# Patient Record
Sex: Male | Born: 1960 | Race: White | Hispanic: No | Smoking: Current every day smoker
Health system: Southern US, Community
[De-identification: ages and names within clinical notes are randomized; demographics above are authoritative.]

## PROBLEM LIST (undated history)

## (undated) DIAGNOSIS — I10 Essential (primary) hypertension: Secondary | ICD-10-CM

## (undated) HISTORY — PX: NECK SURGERY: SHX720

---

## 1999-11-25 ENCOUNTER — Encounter: Payer: Self-pay | Admitting: Emergency Medicine

## 1999-11-25 ENCOUNTER — Emergency Department (HOSPITAL_COMMUNITY): Admission: EM | Admit: 1999-11-25 | Discharge: 1999-11-25 | Payer: Self-pay | Admitting: Emergency Medicine

## 2000-04-08 ENCOUNTER — Emergency Department (HOSPITAL_COMMUNITY): Admission: EM | Admit: 2000-04-08 | Discharge: 2000-04-08 | Payer: Self-pay | Admitting: Emergency Medicine

## 2001-11-11 ENCOUNTER — Emergency Department (HOSPITAL_COMMUNITY): Admission: EM | Admit: 2001-11-11 | Discharge: 2001-11-11 | Payer: Self-pay | Admitting: *Deleted

## 2002-04-11 ENCOUNTER — Emergency Department (HOSPITAL_COMMUNITY): Admission: EM | Admit: 2002-04-11 | Discharge: 2002-04-11 | Payer: Self-pay | Admitting: Emergency Medicine

## 2003-01-02 ENCOUNTER — Emergency Department (HOSPITAL_COMMUNITY): Admission: EM | Admit: 2003-01-02 | Discharge: 2003-01-02 | Payer: Self-pay | Admitting: Emergency Medicine

## 2003-01-02 ENCOUNTER — Encounter: Payer: Self-pay | Admitting: Emergency Medicine

## 2003-01-03 ENCOUNTER — Emergency Department (HOSPITAL_COMMUNITY): Admission: EM | Admit: 2003-01-03 | Discharge: 2003-01-03 | Payer: Self-pay | Admitting: *Deleted

## 2006-01-26 ENCOUNTER — Emergency Department (HOSPITAL_COMMUNITY): Admission: EM | Admit: 2006-01-26 | Discharge: 2006-01-26 | Payer: Self-pay | Admitting: Emergency Medicine

## 2006-06-26 ENCOUNTER — Ambulatory Visit (HOSPITAL_COMMUNITY): Admission: RE | Admit: 2006-06-26 | Discharge: 2006-06-26 | Payer: Self-pay | Admitting: Family Medicine

## 2007-05-08 ENCOUNTER — Ambulatory Visit: Payer: Self-pay | Admitting: Orthopedic Surgery

## 2007-05-22 ENCOUNTER — Ambulatory Visit: Payer: Self-pay | Admitting: Orthopedic Surgery

## 2007-05-28 ENCOUNTER — Encounter (HOSPITAL_COMMUNITY): Admission: RE | Admit: 2007-05-28 | Discharge: 2007-06-27 | Payer: Self-pay | Admitting: Orthopedic Surgery

## 2007-06-19 ENCOUNTER — Ambulatory Visit: Payer: Self-pay | Admitting: Orthopedic Surgery

## 2007-07-02 ENCOUNTER — Ambulatory Visit: Payer: Self-pay | Admitting: Orthopedic Surgery

## 2007-07-15 ENCOUNTER — Encounter: Payer: Self-pay | Admitting: Orthopedic Surgery

## 2007-08-06 ENCOUNTER — Ambulatory Visit (HOSPITAL_COMMUNITY): Admission: RE | Admit: 2007-08-06 | Discharge: 2007-08-07 | Payer: Self-pay | Admitting: Neurosurgery

## 2007-08-27 ENCOUNTER — Encounter: Payer: Self-pay | Admitting: Orthopedic Surgery

## 2007-09-29 ENCOUNTER — Encounter: Payer: Self-pay | Admitting: Orthopedic Surgery

## 2007-10-30 ENCOUNTER — Encounter: Payer: Self-pay | Admitting: Orthopedic Surgery

## 2007-11-26 ENCOUNTER — Ambulatory Visit (HOSPITAL_COMMUNITY): Admission: RE | Admit: 2007-11-26 | Discharge: 2007-11-26 | Payer: Self-pay | Admitting: Urology

## 2008-01-06 ENCOUNTER — Encounter: Payer: Self-pay | Admitting: Orthopedic Surgery

## 2008-02-05 ENCOUNTER — Encounter: Payer: Self-pay | Admitting: Orthopedic Surgery

## 2008-02-06 ENCOUNTER — Encounter: Payer: Self-pay | Admitting: Orthopedic Surgery

## 2008-03-01 ENCOUNTER — Encounter: Payer: Self-pay | Admitting: Orthopedic Surgery

## 2008-04-14 ENCOUNTER — Encounter: Payer: Self-pay | Admitting: Orthopedic Surgery

## 2008-04-20 ENCOUNTER — Encounter: Payer: Self-pay | Admitting: Orthopedic Surgery

## 2008-05-18 ENCOUNTER — Encounter: Payer: Self-pay | Admitting: Infectious Diseases

## 2008-08-05 ENCOUNTER — Encounter: Payer: Self-pay | Admitting: Orthopedic Surgery

## 2008-08-25 ENCOUNTER — Observation Stay (HOSPITAL_COMMUNITY): Admission: RE | Admit: 2008-08-25 | Discharge: 2008-08-27 | Payer: Self-pay | Admitting: Neurosurgery

## 2008-09-16 ENCOUNTER — Encounter: Payer: Self-pay | Admitting: Orthopedic Surgery

## 2008-09-21 ENCOUNTER — Emergency Department (HOSPITAL_COMMUNITY): Admission: EM | Admit: 2008-09-21 | Discharge: 2008-09-21 | Payer: Self-pay | Admitting: Emergency Medicine

## 2008-10-26 ENCOUNTER — Encounter: Payer: Self-pay | Admitting: Orthopedic Surgery

## 2008-11-23 ENCOUNTER — Encounter: Payer: Self-pay | Admitting: Orthopedic Surgery

## 2008-12-04 ENCOUNTER — Encounter: Payer: Self-pay | Admitting: Orthopedic Surgery

## 2008-12-13 ENCOUNTER — Encounter: Payer: Self-pay | Admitting: Orthopedic Surgery

## 2009-01-06 ENCOUNTER — Ambulatory Visit: Payer: Self-pay | Admitting: Orthopedic Surgery

## 2009-01-06 DIAGNOSIS — R209 Unspecified disturbances of skin sensation: Secondary | ICD-10-CM

## 2009-01-10 ENCOUNTER — Encounter: Payer: Self-pay | Admitting: Orthopedic Surgery

## 2009-01-10 ENCOUNTER — Telehealth: Payer: Self-pay | Admitting: Orthopedic Surgery

## 2009-01-11 ENCOUNTER — Encounter: Payer: Self-pay | Admitting: Orthopedic Surgery

## 2009-01-12 ENCOUNTER — Encounter: Payer: Self-pay | Admitting: Orthopedic Surgery

## 2009-01-20 ENCOUNTER — Telehealth: Payer: Self-pay | Admitting: Orthopedic Surgery

## 2009-01-24 ENCOUNTER — Telehealth: Payer: Self-pay | Admitting: Orthopedic Surgery

## 2009-01-25 ENCOUNTER — Ambulatory Visit: Payer: Self-pay | Admitting: Orthopedic Surgery

## 2009-01-25 DIAGNOSIS — G5600 Carpal tunnel syndrome, unspecified upper limb: Secondary | ICD-10-CM | POA: Insufficient documentation

## 2009-02-03 ENCOUNTER — Encounter: Payer: Self-pay | Admitting: Orthopedic Surgery

## 2009-03-01 ENCOUNTER — Encounter: Payer: Self-pay | Admitting: Orthopedic Surgery

## 2009-05-02 ENCOUNTER — Encounter: Payer: Self-pay | Admitting: Orthopedic Surgery

## 2009-06-30 ENCOUNTER — Emergency Department (HOSPITAL_COMMUNITY): Admission: EM | Admit: 2009-06-30 | Discharge: 2009-07-01 | Payer: Self-pay | Admitting: Emergency Medicine

## 2009-11-16 ENCOUNTER — Emergency Department (HOSPITAL_COMMUNITY): Admission: EM | Admit: 2009-11-16 | Discharge: 2009-11-17 | Payer: Self-pay | Admitting: Emergency Medicine

## 2009-12-14 ENCOUNTER — Emergency Department (HOSPITAL_COMMUNITY): Admission: EM | Admit: 2009-12-14 | Discharge: 2009-12-14 | Payer: Self-pay | Admitting: Emergency Medicine

## 2011-01-19 LAB — POCT CARDIAC MARKERS
CKMB, poc: 1.3 ng/mL (ref 1.0–8.0)
Myoglobin, poc: 175 ng/mL (ref 12–200)

## 2011-01-19 LAB — DIFFERENTIAL
Basophils Relative: 1 % (ref 0–1)
Eosinophils Absolute: 0.1 10*3/uL (ref 0.0–0.7)
Lymphocytes Relative: 27 % (ref 12–46)
Monocytes Absolute: 0.4 10*3/uL (ref 0.1–1.0)
Neutro Abs: 6.7 10*3/uL (ref 1.7–7.7)

## 2011-01-19 LAB — RAPID URINE DRUG SCREEN, HOSP PERFORMED
Amphetamines: NOT DETECTED
Barbiturates: NOT DETECTED
Cocaine: NOT DETECTED
Tetrahydrocannabinol: POSITIVE — AB

## 2011-01-19 LAB — BASIC METABOLIC PANEL
BUN: 13 mg/dL (ref 6–23)
CO2: 24 mEq/L (ref 19–32)
Calcium: 8.7 mg/dL (ref 8.4–10.5)
Chloride: 105 mEq/L (ref 96–112)
Creatinine, Ser: 1.64 mg/dL — ABNORMAL HIGH (ref 0.4–1.5)
GFR calc Af Amer: 55 mL/min — ABNORMAL LOW (ref >60)
Potassium: 3.5 mEq/L (ref 3.5–5.1)

## 2011-01-19 LAB — CBC
Hemoglobin: 16 g/dL (ref 13.0–17.0)
RBC: 4.53 MIL/uL (ref 4.22–5.81)

## 2011-02-27 NOTE — Op Note (Signed)
NAME:  David Watson, David Watson NO.:  [id number]   MEDICAL RECORD NO.:  [id number]          PATIENT TYPE:  OIB   LOCATION:  5152                         FACILITY:  [facility]   PHYSICIAN:  Coletta Memos, M.D.     DATE OF BIRTH:  1961-07-14   DATE OF PROCEDURE:  08/06/2007  DATE OF DISCHARGE:                               OPERATIVE REPORT   PREOPERATIVE DIAGNOSES:  1. Displaced disk C6-7 right.  2. Right C7 radiculopathy.   POSTOPERATIVE DIAGNOSES:  1. Displaced disk C6-7 right.  2. Right C7 radiculopathy.   PROCEDURE:  1. Anterior cervical decompression C6-7.  2. Arthrodesis C6-7 with 7 mm allograft which a structural.  3. Anterior instrumentation, 16 mm Vector plate with 01-UU self-      tapping screws.   SURGEON:  Coletta Memos, M.D.   ASSISTANT:  Stefani Dama, M.D.   COMPLICATIONS:  None.   ANESTHESIA:  General endotracheal.   INDICATIONS:  David Watson presented with significant pain in the right  upper extremity secondary to a large herniated disk at C6-7.  He also  had weakness in the right triceps.  I therefore recommended and he  agreed to undergo operative decompression.   OPERATIVE NOTE:  David Watson was brought to the operating room intubated  and placed under general anesthetic.  His head was placed a horseshoe  head rest in neutral position.  His neck was prepped and he was draped  in sterile fashion.  I infiltrated 6 mL 0.5% lidocaine, 1:200,000  epinephrine at the level of the cricothyroid membrane starting from the  midline extending to the medial border of the left sternocleidomastoid  muscle.  I opened the skin with a #10 blade and I took this down to the  platysma.  I then dissected in a plane superior to the platysma  rostrally and caudally.  I opened the platysma horizontal fashion using  Metzenbaum scissors.  I then dissected in a plane inferior to the  platysma rostrally and caudally to increase my working space.  I  identified the medial  strap muscles and sternocleidomastoid.  I was able  to retract the strap muscles in the omohyoid medially.  The  sternocleidomastoid and carotid artery were retracted laterally.  I  placed spinal needles to localize the disk space and was able to confirm  the C6-7 space.  I then reflected the longus colli muscles bilaterally.  I placed a self-retaining retractor.  I opened the disk space at C6-7.  I then placed distraction pins, one at C6 and one at C7, distracted the  disk space and brought the microscope into the operative field.   With microdissection, I then decompressed the spinal canal by removing  the disk and osteophytes using a high-speed drill and pituitary  rongeurs.  Kerrison punches also used to remove osteophytes and disk  material.  I was able fully decompress the right C7 nerve root.  I was  not as aggressive on the left side as he had no pain there, nor was  there any indication of compression of the nerve root.  After thoroughly  decompressing  the spinal canal and nerve roots, I then turned my  attention to the arthrodesis.   I used a high-speed drill to even the surfaces of the vertebrae of C6  and C7.  I then sized the disk space and felt that a 7-mm structural  graft would be appropriate.  I then placed a 7 mm graft and released the  distraction.  I removed the distraction pins and prepared for the  anterior instrumentation.  I sized a 60 mm plate and felt that that was  correct size.  I then placed four screws, two in C6, two in C7 using a  drill and self-tapping screws.  Dr. Danielle Dess did assist with the  decompression and arthrodesis.  I then closed the wound in a layered  fashion using Vicryl sutures.  I did not attempt another x-ray as it was  nearly impossible to see the C6-7 level.  I irrigated.  Hemostasis was  obtained.  I closed the wound in layered fashion using Vicryl sutures.  Dermabond used for sterile dressing.            ______________________________  Coletta Memos, M.D.     KC/MEDQ  D:  08/06/2007  T:  08/07/2007  Job:  [id number]

## 2011-02-27 NOTE — Op Note (Signed)
NAME:  [name], [name] NO.:  [id number]   MEDICAL RECORD NO.:  [id number]          PATIENT TYPE:  INP   LOCATION:  2899                         FACILITY:  [facility]   PHYSICIAN:  Coletta Memos, M.D.     DATE OF BIRTH:  30-Jan-1961   DATE OF PROCEDURE:  08/25/2008  DATE OF DISCHARGE:                               OPERATIVE REPORT   PREOPERATIVE DIAGNOSIS:  Pseudoarthrosis, status post anterior cervical  decompression arthrodesis C6-C7.   POSTOPERATIVE DIAGNOSIS:  Pseudoarthrosis, status post anterior cervical  decompression arthrodesis C6-C7.   PROCEDURE:  Posterior spinal fusion with lateral mass screws C6-C7 and  posterior arthrodesis with morselized allograft both mosaic and InFuse.   COMPLICATIONS:  None.   SURGEON:  Coletta Memos, MD   ASSISTANT:  Hewitt Shorts, MD   INDICATIONS:  David Watson is a 50 year old whom I took to the  operating room for a herniated disk at C6-C7 space.  Postoperatively, he  did well with regards to the upper extremity pain but always had some  neck pain.  This persisted and did not respond to conservative measures.  I finally had him try Richie brace and that helped.  The plane CT had  shown that he had a pseudoarthrosis at that level.  I then offered and  he agreed to undergo posterior spinal fusion for the pseudoarthrosis.   OPERATIVE NOTE:  Mr. Hickox was brought to the operating room intubated  and placed under general anesthetic without difficulty.  He is rolled  prone after having a three-pin Mayfield head holder attached to  approximately 65 pounds of pressure.  He was positioned prone and all  pressure points appropriately padded.  __________ clear of any pressure.  His skin was prepped.  He was draped in a sterile fashion.  I then  opened the posterior cervical region with a #10 blade and took this down  to the posterior cervical fascia.  I exposed what I believed to be the  lamina spinous processes of C7 and C6.   With fluoroscopy, I then counted  the lamina performed the operation,  and it appeared that it was,  I had  this confirmed by our partner Dr. Shirlean Kelly.   I then placed lateral mass screws to C6-C7, 14 mm screws x 4 mm x 4 mm.  I then placed rods.  I decorticated the lamina and facet joints  bilaterally placed with InFuse.  I placed infuse both sides and  morselized allograft being mosaic.  I then irrigated.  I then closed  wound in layered fashion using Vicryl sutures to reapproximate the  posterior cervical fascia.  Dr. Newell Coral assisted with the posterior  arthrodesis and closure.  Sterile dressing was applied in the form of  Dermabond.  Mr. Armwood tolerated the procedure well, moving all  extremities postoperatively.           ______________________________  Coletta Memos, M.D.    KC/MEDQ  D:  08/25/2008  T:  08/26/2008  Job:  [id number]
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
## 2011-07-17 LAB — BASIC METABOLIC PANEL
Chloride: 105
GFR calc Af Amer: >60
GFR calc non Af Amer: >60
Potassium: 4.3
Sodium: 138

## 2011-07-17 LAB — CBC
HCT: 46
MCHC: 33.4

## 2011-07-25 LAB — CBC
HCT: 44
Hemoglobin: 15.2
MCHC: 34.5
MCV: 95.7
RBC: 4.6
RDW: 13.3

## 2012-10-28 ENCOUNTER — Observation Stay (HOSPITAL_COMMUNITY)
Admission: EM | Admit: 2012-10-28 | Discharge: 2012-10-29 | Disposition: A | Discharge status: Home / Self Care | Payer: Self-pay | Attending: Internal Medicine | Admitting: Internal Medicine

## 2012-10-28 ENCOUNTER — Emergency Department (HOSPITAL_COMMUNITY): Payer: Self-pay

## 2012-10-28 ENCOUNTER — Encounter (HOSPITAL_COMMUNITY): Payer: Self-pay | Admitting: *Deleted

## 2012-10-28 DIAGNOSIS — M479 Spondylosis, unspecified: Secondary | ICD-10-CM | POA: Insufficient documentation

## 2012-10-28 DIAGNOSIS — Z720 Tobacco use: Secondary | ICD-10-CM | POA: Diagnosis present

## 2012-10-28 DIAGNOSIS — I10 Essential (primary) hypertension: Secondary | ICD-10-CM | POA: Insufficient documentation

## 2012-10-28 DIAGNOSIS — R079 Chest pain, unspecified: Principal | ICD-10-CM | POA: Insufficient documentation

## 2012-10-28 DIAGNOSIS — F17200 Nicotine dependence, unspecified, uncomplicated: Secondary | ICD-10-CM | POA: Insufficient documentation

## 2012-10-28 DIAGNOSIS — M1990 Unspecified osteoarthritis, unspecified site: Secondary | ICD-10-CM | POA: Diagnosis present

## 2012-10-28 DIAGNOSIS — Z8249 Family history of ischemic heart disease and other diseases of the circulatory system: Secondary | ICD-10-CM | POA: Insufficient documentation

## 2012-10-28 HISTORY — DX: Essential (primary) hypertension: I10

## 2012-10-28 LAB — COMPREHENSIVE METABOLIC PANEL
ALT: 12 U/L (ref 0–53)
AST: 15 U/L (ref 0–37)
Albumin: 3.3 g/dL — ABNORMAL LOW (ref 3.5–5.2)
Alkaline Phosphatase: 63 U/L (ref 39–117)
Alkaline Phosphatase: 52 U/L (ref 39–117)
BUN: 14 mg/dL (ref 6–23)
BUN: 12 mg/dL (ref 6–23)
CO2: 23 mEq/L (ref 19–32)
CO2: 24 mEq/L (ref 19–32)
Calcium: 8.7 mg/dL (ref 8.4–10.5)
Chloride: 104 mEq/L (ref 96–112)
Creatinine, Ser: 1.12 mg/dL (ref 0.50–1.35)
GFR calc Af Amer: >90 mL/min (ref >90)
GFR calc Af Amer: 87 mL/min — ABNORMAL LOW (ref >90)
GFR calc non Af Amer: 86 mL/min — ABNORMAL LOW (ref >90)
GFR calc non Af Amer: 75 mL/min — ABNORMAL LOW (ref >90)
Glucose, Bld: 104 mg/dL — ABNORMAL HIGH (ref 70–99)
Glucose, Bld: 83 mg/dL (ref 70–99)
Potassium: 4.3 mEq/L (ref 3.5–5.1)
Potassium: 4 mEq/L (ref 3.5–5.1)
Sodium: 136 mEq/L (ref 135–145)
Total Bilirubin: 0.5 mg/dL (ref 0.3–1.2)
Total Protein: 7 g/dL (ref 6.0–8.3)
Total Protein: 6.3 g/dL (ref 6.0–8.3)

## 2012-10-28 LAB — CBC WITH DIFFERENTIAL/PLATELET
Basophils Absolute: 0.1 10*3/uL (ref 0.0–0.1)
Basophils Relative: 1 % (ref 0–1)
Eosinophils Absolute: 0.2 10*3/uL (ref 0.0–0.7)
Eosinophils Relative: 3 % (ref 0–5)
HCT: 41.5 % (ref 39.0–52.0)
Hemoglobin: 14.4 g/dL (ref 13.0–17.0)
Lymphocytes Relative: 38 % (ref 12–46)
Lymphs Abs: 3.1 10*3/uL (ref 0.7–4.0)
MCH: 33.6 pg (ref 26.0–34.0)
MCHC: 34.7 g/dL (ref 30.0–36.0)
MCV: 97 fL (ref 78.0–100.0)
Monocytes Absolute: 0.5 10*3/uL (ref 0.1–1.0)
Monocytes Relative: 7 % (ref 3–12)
Neutro Abs: 4.3 10*3/uL (ref 1.7–7.7)
Neutrophils Relative %: 53 % (ref 43–77)
Platelets: 230 10*3/uL (ref 150–400)
RBC: 4.28 MIL/uL (ref 4.22–5.81)
RDW: 13 % (ref 11.5–15.5)
WBC: 8.2 10*3/uL (ref 4.0–10.5)

## 2012-10-28 LAB — PROTIME-INR
INR: 1.01 (ref 0.00–1.49)
Prothrombin Time: 13.2 seconds (ref 11.6–15.2)

## 2012-10-28 LAB — CBC
HCT: 45 % (ref 39.0–52.0)
Hemoglobin: 16.1 g/dL (ref 13.0–17.0)
MCH: 34.3 pg — ABNORMAL HIGH (ref 26.0–34.0)
MCHC: 35.8 g/dL (ref 30.0–36.0)
RBC: 4.7 MIL/uL (ref 4.22–5.81)

## 2012-10-28 LAB — D-DIMER, QUANTITATIVE: D-Dimer, Quant: <0.27 ug/mL-FEU (ref 0.00–0.48)

## 2012-10-28 LAB — APTT: aPTT: 31 seconds (ref 24–37)

## 2012-10-28 LAB — PRO B NATRIURETIC PEPTIDE: Pro B Natriuretic peptide (BNP): 9 pg/mL (ref 0–125)

## 2012-10-28 LAB — POCT I-STAT TROPONIN I: Troponin i, poc: 0 ng/mL (ref 0.00–0.08)

## 2012-10-28 LAB — TROPONIN I: Troponin I: <0.3 ng/mL (ref <0.30)

## 2012-10-28 MED ORDER — ASPIRIN 300 MG RE SUPP
300.0000 mg | RECTAL | Status: DC
  Filled 2012-10-28: qty 1

## 2012-10-28 MED ORDER — MORPHINE SULFATE 4 MG/ML IJ SOLN
6.0000 mg | Freq: Once | INTRAMUSCULAR | Status: AC
  Administered 2012-10-28: 6 mg via INTRAVENOUS
  Filled 2012-10-28: qty 2

## 2012-10-28 MED ORDER — KETOROLAC TROMETHAMINE 30 MG/ML IJ SOLN
30.0000 mg | Freq: Once | INTRAMUSCULAR | Status: AC
  Administered 2012-10-28: 30 mg via INTRAVENOUS
  Filled 2012-10-28: qty 1

## 2012-10-28 MED ORDER — SIMVASTATIN 20 MG PO TABS
20.0000 mg | ORAL_TABLET | Freq: Every day | ORAL | Status: DC
  Filled 2012-10-28: qty 1

## 2012-10-28 MED ORDER — ASPIRIN EC 81 MG PO TBEC
81.0000 mg | DELAYED_RELEASE_TABLET | Freq: Every day | ORAL | Status: DC
  Administered 2012-10-29: 81 mg via ORAL
  Filled 2012-10-28: qty 1

## 2012-10-28 MED ORDER — GADOBENATE DIMEGLUMINE 529 MG/ML IV SOLN
18.0000 mL | Freq: Once | INTRAVENOUS | Status: AC
  Administered 2012-10-28: 18 mL via INTRAVENOUS

## 2012-10-28 MED ORDER — METOPROLOL TARTRATE 12.5 MG HALF TABLET
12.5000 mg | ORAL_TABLET | Freq: Two times a day (BID) | ORAL | Status: DC
  Administered 2012-10-28 – 2012-10-29 (×2): 12.5 mg via ORAL
  Filled 2012-10-28 (×3): qty 1

## 2012-10-28 MED ORDER — ONDANSETRON HCL 4 MG/2ML IJ SOLN
4.0000 mg | Freq: Once | INTRAMUSCULAR | Status: AC
  Administered 2012-10-28: 4 mg via INTRAVENOUS
  Filled 2012-10-28: qty 2

## 2012-10-28 MED ORDER — ASPIRIN 81 MG PO CHEW: 324.0000 mg | CHEWABLE_TABLET | ORAL | Status: DC

## 2012-10-28 MED ORDER — NITROGLYCERIN 0.4 MG SL SUBL: 0.4000 mg | SUBLINGUAL_TABLET | SUBLINGUAL | Status: DC | PRN

## 2012-10-28 MED ORDER — HYDROMORPHONE HCL PF 1 MG/ML IJ SOLN
1.0000 mg | INTRAMUSCULAR | Status: DC | PRN
  Administered 2012-10-28: 1 mg via INTRAVENOUS
  Filled 2012-10-28: qty 1

## 2012-10-28 MED ORDER — ENOXAPARIN SODIUM 40 MG/0.4ML ~~LOC~~ SOLN
40.0000 mg | SUBCUTANEOUS | Status: DC
  Filled 2012-10-28 (×2): qty 0.4

## 2012-10-28 MED ORDER — TRAMADOL HCL 50 MG PO TABS
50.0000 mg | ORAL_TABLET | Freq: Four times a day (QID) | ORAL | Status: DC | PRN
  Administered 2012-10-28: 50 mg via ORAL
  Filled 2012-10-28: qty 1

## 2012-10-28 MED ORDER — SODIUM CHLORIDE 0.9 % IJ SOLN
3.0000 mL | Freq: Two times a day (BID) | INTRAMUSCULAR | Status: DC
  Administered 2012-10-29: 3 mL via INTRAVENOUS

## 2012-10-28 MED ORDER — PANTOPRAZOLE SODIUM 40 MG PO TBEC
40.0000 mg | DELAYED_RELEASE_TABLET | Freq: Every day | ORAL | Status: DC
  Administered 2012-10-29: 40 mg via ORAL
  Filled 2012-10-28: qty 1

## 2012-10-28 MED ORDER — ONDANSETRON HCL 4 MG/2ML IJ SOLN: 4.0000 mg | Freq: Four times a day (QID) | INTRAMUSCULAR | Status: DC | PRN

## 2012-10-28 MED ORDER — SODIUM CHLORIDE 0.9 % IV SOLN
INTRAVENOUS | Status: DC
  Administered 2012-10-28: 20 mL/h via INTRAVENOUS

## 2012-10-28 MED ORDER — SODIUM CHLORIDE 0.9 % IJ SOLN: 3.0000 mL | INTRAMUSCULAR | Status: DC | PRN

## 2012-10-28 MED ORDER — ALPRAZOLAM 0.5 MG PO TABS
0.5000 mg | ORAL_TABLET | Freq: Three times a day (TID) | ORAL | Status: DC | PRN
  Administered 2012-10-29: 0.5 mg via ORAL
  Filled 2012-10-28: qty 1

## 2012-10-28 MED ORDER — NITROGLYCERIN 0.4 MG SL SUBL
0.4000 mg | SUBLINGUAL_TABLET | SUBLINGUAL | Status: AC | PRN
  Administered 2012-10-28 (×3): 0.4 mg via SUBLINGUAL
  Filled 2012-10-28 (×2): qty 25

## 2012-10-28 MED ORDER — SODIUM CHLORIDE 0.9 % IV SOLN: 250.0000 mL | INTRAVENOUS | Status: DC | PRN

## 2012-10-28 MED ORDER — ZOLPIDEM TARTRATE 5 MG PO TABS: 5.0000 mg | ORAL_TABLET | Freq: Every evening | ORAL | Status: DC | PRN

## 2012-10-28 MED ORDER — ACETAMINOPHEN 325 MG PO TABS: 650.0000 mg | ORAL_TABLET | ORAL | Status: DC | PRN

## 2012-10-28 NOTE — H&P (Signed)
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 The pt was at Dr David Watson office today to get a refill of his Lisinopril.  He mentioned to Dr David Watson he had [name] which has actually been going on for some time but worse last 2 days. He describes Lt sided, sharp c/p with Lt arm  numbness and radiation to his sternum and back.  In addition he complains of [name]. He has taken a NTG with no relief. He tells me he also has been waking up SOB in the middle of the night. He says he has had no prior cardiac evaluation. He continues to have pain in the ER. Troponin and EKG NL.   Problem List: Past Medical History  Diagnosis Date  . Hypertension     put on ACE 6-8wks ago, ran out    Past Surgical History  Procedure Date  . Neck surgery      Allergies:  Allergies  Allergen Reactions  . Lisinopril Itching     Home Medications     Family History  Problem Relation Age of Onset  . Coronary artery disease Father 26    MI     History   Social History  . Marital Status: Divorced    Spouse Name: N/A    Number of Children: N/A  . Years of Education: N/A   Occupational History  . Not on file.   Social History Main Topics  . Smoking status: Current Every Day Smoker  . Smokeless tobacco: Not on file  . Alcohol Use: Yes  . Drug Use: No  . Sexually Active:    Other Topics Concern  . Not on file   Social History Narrative   Unemployed, applying for disability secondary to chronic neck problems     Review of Systems: Multiple complaints, SOB, depression, nausea, palpitations, prior neck surgery X 2. "Seizures" 3 yrs ago. Headaches daily, (Goodies powder) GERD.   Physical Exam: Blood pressure 113/76, pulse  72, temperature 98.2 F (36.8 C), temperature source Oral, resp. rate 16, height 5' 9.5" (1.765 m), weight 90.266 kg (199 lb), SpO2 97.00%.  General appearance: alert, cooperative and no distress Neck: no carotid bruit and no JVD Lungs: clear to auscultation bilaterally Heart: regular rate and rhythm, S1, S2 normal, no murmur, click, rub or gallop Abdomen: soft, non-tender; bowel sounds normal; no masses,  no organomegaly Extremities: extremities normal, atraumatic, no cyanosis or edema Pulses: 2+ and symmetric Skin: Skin color, texture, turgor normal. No rashes or lesions Neurologic: Numbness to touch Lt face and Lt arm    Labs:   Results for orders placed during the hospital encounter of 10/28/12 (from the past 24 hour(s))  CBC     Status: Abnormal   Collection Time   10/28/12  9:49 AM      Component Value Range   WBC 8.2  4.0 - 10.5 K/uL   RBC 4.70  4.22 - 5.81 MIL/uL   Hemoglobin 16.1  13.0 - 17.0 g/dL   HCT 40.9  81.1 - 91.4 %   MCV 95.7  78.0 - 100.0 fL   MCH 34.3 (*) 26.0 - 34.0 pg   MCHC 35.8  30.0 - 36.0 g/dL   RDW 78.2  95.6 -  15.5 %   Platelets 243  150 - 400 K/uL  COMPREHENSIVE METABOLIC PANEL     Status: Abnormal   Collection Time   10/28/12 10:11 AM      Component Value Range   Sodium 139  135 - 145 mEq/L   Potassium 4.3  3.5 - 5.1 mEq/L   Chloride 103  96 - 112 mEq/L   CO2 23  19 - 32 mEq/L   Glucose, Bld 104 (*) 70 - 99 mg/dL   BUN 14  6 - 23 mg/dL   Creatinine, Ser 1.61  0.50 - 1.35 mg/dL   Calcium 9.3  8.4 - 09.6 mg/dL   Total Protein 7.0  6.0 - 8.3 g/dL   Albumin 3.9  3.5 - 5.2 g/dL   AST 18  0 - 37 U/L   ALT 16  0 - 53 U/L   Alkaline Phosphatase 63  39 - 117 U/L   Total Bilirubin 0.3  0.3 - 1.2 mg/dL   GFR calc non Af Amer 86 (*) >90 mL/min   GFR calc Af Amer >90  >90 mL/min  POCT I-STAT TROPONIN I     Status: Normal   Collection Time   10/28/12 10:40 AM      Component Value Range   Troponin i, poc 0.00  0.00 - 0.08 ng/mL   Comment 3               Radiology/Studies: Dg Chest 2 View  10/28/2012  *RADIOLOGY REPORT*  Clinical Data: Shortness of breath  CHEST - 2 VIEW  Comparison:  June 30, 2009  Findings: Lungs clear.  Heart size and pulmonary vascularity are normal.  No adenopathy.  No bone lesions.  There is postoperative change in the lower cervical spine. No pneumothorax.  IMPRESSION: No edema or consolidation.   Original Report Authenticated By: Bretta Bang, M.D.     EKG:  NSR without acute changes  ASSESSMENT AND PLAN:   Principal Problem:  *Chest pain with moderate risk for cardiac etiology  Active Problems:  Smoking  Family history of coronary artery disease  DJD (degenerative joint disease), C- spine surgury X 2  HTN  Gerd  Plan- Will discuss with MD. Chest pain with some atypical features with NL Troponin and EKG. He appears depressed and I suspect this is playing at least some role here. He does have some risk factors for CAD. Will try Toradol X 1.   David Pretty, PA-C 10/28/2012, 1:42 PM

## 2012-10-28 NOTE — ED Provider Notes (Signed)
Medical screening examination/treatment/procedure(s) were performed by non-physician practitioner and as supervising physician I was immediately available for consultation/collaboration.   Dione Booze, MD 10/28/12 1949

## 2012-10-28 NOTE — H&P (Signed)
Pt. Seen and examined. Agree with the NP/PA-C note as written.  52 yo male with a history of HTN, tobacco abuse and family history of premature onset CAD. Presents with 2 days of chest pain, soreness, neck pain and numbness in the left face and arm with weakness of the left arm.  No slurred speech, word finding difficulty or other symptoms concerning for stroke. He has had 2 prior neck surgeries. His chest pain is not associated with exertion.  Some typical and atypical features. EKG does not show ischemic changes. POC troponin is negative. Admit for R/o.  Probable NST in the am tomorrow. Will ask Dr. Sueanne Margarita office to see regarding neck/facial pain, numbness and weakened handgrip.  Chrystie Nose, MD, [facility] Attending Cardiologist [facility]

## 2012-10-28 NOTE — ED Provider Notes (Signed)
History     CSN: [id number]  Arrival date & time 10/28/12  0913   First MD Initiated Contact with Patient 10/28/12 [phone]      Chief Complaint  Patient presents with  . Chest Pain    (Consider location/radiation/quality/duration/timing/severity/associated sxs/prior treatment) HPI  David Watson is a 52 y.o. male c/o sharp left sided chest pain, radiating to left arm,  rated at 5/10 at worst, 5/10 now. Relieved by rest. Pain started x2days ago.  Pain has been constant and exacerbated by exertion. Pain is associated with SOB, Nausea. Denies emesis, diaphoresis, cough, fever,back pain, syncope.  Denies recent travel, leg swelling, hemoptysis.  rec'd full dose ASA and sl NTG PTA.   RF: active smoker ~25pack years, HTN, +FH (Father had MI in his 68's) Cath: ? Last Stress test: ? Cardiologost: [place].  PCP: Renette Butters in [place] @ [facility]  Past Medical History  Diagnosis Date  . Hypertension     Past Surgical History  Procedure Date  . Neck surgery     No family history on file.  History  Substance Use Topics  . Smoking status: Current Every Day Smoker  . Smokeless tobacco: Not on file  . Alcohol Use: Yes      Review of Systems  Constitutional: Negative for fever.  Respiratory: Positive for shortness of breath.   Cardiovascular: Positive for chest pain.  Gastrointestinal: Positive for nausea. Negative for vomiting, abdominal pain and diarrhea.  All other systems reviewed and are negative.    Allergies  Lisinopril  Home Medications  No current outpatient prescriptions on file.  Ht 5' 9.5" (1.765 m)  Wt 199 lb (90.266 kg)  BMI 28.97 kg/m2  Physical Exam  Nursing note and vitals reviewed. Constitutional: He is oriented to person, place, and time. He appears well-developed and well-nourished. No distress.  HENT:  Head: Normocephalic.  Mouth/Throat: Oropharynx is clear and moist.  Eyes: Conjunctivae  normal and EOM are normal. Pupils are equal, round, and reactive to light.  Neck: Normal range of motion. No JVD present.  Cardiovascular: Normal rate, regular rhythm and intact distal pulses.   Pulmonary/Chest: Effort normal and breath sounds normal. No stridor. No respiratory distress. He has no wheezes. He has no rales. He exhibits no tenderness.  Abdominal: Soft. Bowel sounds are normal. He exhibits no distension and no mass. There is no tenderness. There is no rebound and no guarding.  Musculoskeletal: Normal range of motion. He exhibits no edema.  Neurological: He is alert and oriented to person, place, and time.  Psychiatric: He has a normal mood and affect.    ED Course  Procedures (including critical care time)  Labs Reviewed  CBC - Abnormal; Notable for the following:    MCH 34.3 (*)     All other components within normal limits  COMPREHENSIVE METABOLIC PANEL - Abnormal; Notable for the following:    Glucose, Bld 104 (*)     GFR calc non Af Amer 86 (*)     All other components within normal limits  POCT I-STAT TROPONIN I  POCT I-STAT TROPONIN I   Dg Chest 2 View  10/28/2012  *RADIOLOGY REPORT*  Clinical Data: Shortness of breath  CHEST - 2 VIEW  Comparison:  June 30, 2009  Findings: Lungs clear.  Heart size and pulmonary vascularity are normal.  No adenopathy.  No bone lesions.  There is postoperative change in the lower cervical spine. No pneumothorax.  IMPRESSION: No edema  or consolidation.   Original Report Authenticated By: Bretta Bang, M.D.      Date: 10/28/2012  Rate: 73  Rhythm: normal sinus rhythm  QRS Axis: left  Intervals: PR prolonged  ST/T Wave abnormalities: normal  Conduction Disutrbances:nonspecific intraventricular conduction delay  Narrative Interpretation:   Old EKG Reviewed: unchanged  EKG repeated after pain increase to 7/10: it is unchanged.  No diagnosis found.    MDM  Patient has had constant chest pain for 2 days. EKG is  nonischemic and first troponin is negative. Patient has no white blood cell count, chest x-ray is clear and no anemia.  Pain initially improved with nitroglycerin it actually worsened to the most severe it has been in the last 7 days to 7/10. 6 mg of morphine given IV and pain was reduced to 6/10.   Patient is low risk by Wells criteria and PERC negative.   Filed Vitals:   10/28/12 0919 10/28/12 0925  BP:  142/97  Pulse:  72  Temp:  98.2 F (36.8 C)  TempSrc:  Oral  Resp:  16  Height: 5' 9.5" (1.765 m)   Weight: 199 lb (90.266 kg)   SpO2:  97%   This is a shared visit with attending Dr. now. He feels that the patient's symptoms are also unlikely to be from cardiac origin.  Cardiology consult from [facility] Leron Croak appreciated: He will come to evaluate the patient.  Patient pending coronary CT is ordered by [facility] cardiology. Case signed out to PA Muthersbaugh at shift change. Plan is to DC home with pain control medications and close followup with [facility] cardiology if CT is negative  Wynetta Emery, PA-C 10/28/12 1546

## 2012-10-28 NOTE — ED Notes (Signed)
Patient with onset of chest pain x 3.  Patient reports left sided chest pain.  Patient denies sob.  Patient was seen at his md office and sent to ED for further eval of chest pain. Patient did receive aspirin 324 and nitro x 1, pain is currently 3/10

## 2012-10-28 NOTE — ED Notes (Signed)
Called report to unit 3000. 

## 2012-10-28 NOTE — ED Notes (Signed)
Pt states "morphine done nothing for my pain"

## 2012-10-28 NOTE — ED Notes (Signed)
Patient states his left sided chest pain increased when he raised his left arm over his head for his CT. Described as the same CP he has been having.

## 2012-10-28 NOTE — ED Provider Notes (Signed)
S: 52 y.o. with  2 days of L sided chest pain with associated SOB and nausea; PMHx HTN; FMHx MI at age 8  PE/labs: ECG nonischemic, negative troponin, basic labs unremarkable  David Watson discussed with [facility] who wants a coronary CT performed  Plan: if Coronary CT negative pt may be d/c with close [facility] f/u  7:42 PM Pt to be admitted to [facility] for further evaluation.  Regular diet ordered.    Dahlia Client Altie Savard, PA-C 10/28/12 1942

## 2012-10-28 NOTE — ED Provider Notes (Signed)
Medical screening examination/treatment/procedure(s) were conducted as a shared visit with non-physician practitioner(s) and myself.  I personally evaluated the patient during the encounter  Pt has been having constant chest pain for days.  Symptoms are atypical for ACS.  He does have risk factors including family history.  Considering the constant duration and negative enzymes I do not feel that he needs to be admitted to the hospital.  He would benefit from outpatient risk stratification.  Will consult with cardiology to get their opinion and assistance in arranging outpatient follow up.  Celene Kras, MD 10/28/12 1254

## 2012-10-29 ENCOUNTER — Observation Stay (HOSPITAL_COMMUNITY): Payer: Self-pay

## 2012-10-29 ENCOUNTER — Encounter (HOSPITAL_COMMUNITY): Payer: Self-pay | Admitting: *Deleted

## 2012-10-29 LAB — LIPID PANEL
Cholesterol: 156 mg/dL (ref 0–200)
HDL: 52 mg/dL (ref >39)
LDL Cholesterol: 67 mg/dL (ref 0–99)
Total CHOL/HDL Ratio: 3 RATIO
Triglycerides: 186 mg/dL — ABNORMAL HIGH (ref <150)
VLDL: 37 mg/dL (ref 0–40)

## 2012-10-29 LAB — TROPONIN I: Troponin I: <0.3 ng/mL (ref <0.30)

## 2012-10-29 LAB — TSH: TSH: 3.199 u[IU]/mL (ref 0.350–4.500)

## 2012-10-29 MED ORDER — ENALAPRIL MALEATE 10 MG PO TABS: 10.0000 mg | ORAL_TABLET | Freq: Every day | ORAL | Status: DC

## 2012-10-29 MED ORDER — NICOTINE 14 MG/24HR TD PT24: 1.0000 | MEDICATED_PATCH | TRANSDERMAL | Status: DC

## 2012-10-29 MED ORDER — NICOTINE 21 MG/24HR TD PT24: 1.0000 | MEDICATED_PATCH | TRANSDERMAL | Status: DC

## 2012-10-29 MED ORDER — ASPIRIN 81 MG PO TBEC: 81.0000 mg | DELAYED_RELEASE_TABLET | Freq: Every day | ORAL | Status: DC

## 2012-10-29 MED ORDER — TECHNETIUM TC 99M SESTAMIBI GENERIC - CARDIOLITE
30.0000 | Freq: Once | INTRAVENOUS | Status: AC | PRN
  Administered 2012-10-29: 30 via INTRAVENOUS

## 2012-10-29 MED ORDER — REGADENOSON 0.4 MG/5ML IV SOLN
INTRAVENOUS | Status: AC
  Administered 2012-10-29: 0.4 mg via INTRAVENOUS
  Filled 2012-10-29: qty 5

## 2012-10-29 MED ORDER — REGADENOSON 0.4 MG/5ML IV SOLN
0.4000 mg | Freq: Once | INTRAVENOUS | Status: AC
  Administered 2012-10-29: 0.4 mg via INTRAVENOUS
  Filled 2012-10-29: qty 5

## 2012-10-29 MED ORDER — TECHNETIUM TC 99M SESTAMIBI GENERIC - CARDIOLITE
10.0000 | Freq: Once | INTRAVENOUS | Status: AC | PRN
  Administered 2012-10-29: 10 via INTRAVENOUS

## 2012-10-29 MED ORDER — NICOTINE 7 MG/24HR TD PT24: 1.0000 | MEDICATED_PATCH | TRANSDERMAL | Status: DC

## 2012-10-29 NOTE — Progress Notes (Signed)
Subjective:  Still having some chest discomfort  Objective:  Vital Signs in the last 24 hours: Temp:  [98 F (36.7 C)-98.3 F (36.8 C)] 98 F (36.7 C) (01/15 0700) Pulse Rate:  [62-85] 66  (01/15 0700) Resp:  [10-21] 18  (01/15 0700) BP: (104-168)/(73-117) 126/77 mmHg (01/15 0932) SpO2:  [95 %-99 %] 99 % (01/15 0700) Weight:  [90.81 kg (200 lb 3.2 oz)] 90.81 kg (200 lb 3.2 oz) (01/14 2130)  Intake/Output from previous day: No intake or output data in the 24 hours ending 10/29/12 0953  Physical Exam: General appearance: alert, cooperative and no distress Lungs: clear to auscultation bilaterally Heart: regular rate and rhythm ABD: soft BS EXT: no edema   Rate: 60  Rhythm: normal sinus rhythm  Lab Results:  Basename 10/28/12 2145 10/28/12 0949  WBC 8.2 8.2  HGB 14.4 16.1  PLT 230 243    Basename 10/28/12 2145 10/28/12 1011  NA 136 139  K 4.0 4.3  CL 104 103  CO2 24 23  GLUCOSE 83 104*  BUN 12 14  CREATININE 1.12 1.01    Basename 10/29/12 0300 10/28/12 2145  TROPONINI <0.30 <0.30   Hepatic Function Panel  Basename 10/28/12 2145  PROT 6.3  ALBUMIN 3.3*  AST 15  ALT 12  ALKPHOS 52  BILITOT 0.5  BILIDIR --  IBILI --    Basename 10/29/12 0300  CHOL 156    Basename 10/28/12 2145  INR 1.01    Imaging: Imaging results have been reviewed  Cardiac Studies:  Assessment/Plan:   Principal Problem:  *Chest pain with moderate risk for cardiac etiology Active Problems:  Smoking  Family history of coronary artery disease  DJD (degenerative joint disease), C- spine surgury X 2  Plan- Myoview this am.  C-spine MRI unremarkable.     Corine Shelter PA-C 10/29/2012, 9:53 AM  Myoview Results: 1. No evidence of fixed or inducible perfusion abnormality.  2. Estimated ejection fraction 55%.    Patient seen and examined. Agree with assessment and plan. Myoview study normal; still with residual chest soreness and intermittent sharp ache, suspect  musculoskeletal etiology.   Lennette Bihari, MD, [facility] 10/29/2012 2:50 PM

## 2012-10-29 NOTE — Discharge Summary (Signed)
Physician Discharge Summary  Patient ID: SQUARE JOWETT MRN: [id number] DOB/AGE: 05-10-1961 52 y.o.  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 The pt was at Dr Lamar Blinks office today to get a refill of his Enalapril. He mentioned to Dr Phillips Odor he had [name] which has actually been going on for some time but worse last 2 days. He describes Lt sided, sharp c/p with Lt arm numbness and radiation to his sternum and back. In addition he complains of [name]. He has taken a NTG with no relief. He tells me he also has been waking up SOB in the middle of the night. He says he has had no prior cardiac evaluation. He continued to have pain in the ER. Troponin and EKG NL.  The patient was admitted for observation and scheduled for [facility].  The test revealed an EF of 55% with no inducible ischemia.  Unremarkable MRI of the cervical spine.  Solid appearing C 6-7 fusion.  Toradol was given for CP.  Follow up recommended with Dr. Franky Macho regarding neck/facial pain, numbness and weakened handgrip.  Triglycerides were mildly elevated.  Lifestyle modification recommended and discussed.  The patient was seen by Dr. Tresa Endo who felt he was stable for DC home.    Consults: None  Significant Diagnostic Studies:  MYOCARDIAL IMAGING WITH SPECT (REST AND PHARMACOLOGIC-STRESS)  GATED LEFT VENTRICULAR WALL MOTION STUDY  LEFT VENTRICULAR EJECTION FRACTION  Technique: Standard myocardial SPECT imaging was performed after   resting intravenous injection of 10 mCi Tc-77m sestamibi.  Subsequently, intravenous infusion of regadenoson was performed  under the supervision of the Cardiology staff. At peak effect of  the drug, 30 mCi Tc-74m sestamibi was injected intravenously and  standard myocardial SPECT imaging was performed. Quantitative  gated imaging was also performed to evaluate left ventricular wall  motion, and estimate left ventricular ejection fraction.  Comparison: None.  Findings:  End-diastolic volume 131 ml, end-systolic volume 59 ml yielding an  estimated ejection fraction of 55%. No cardiac wall motion  abnormality. Symmetric perfusion throughout the myocardium on the  stress and rest images. No fixed or inducible perfusion defect.  No evidence of global endocardial dilatation.  IMPRESSION:  1. No evidence of fixed or inducible perfusion abnormality.  2. Estimated ejection fraction 55%.   MRI CERVICAL SPINE WITHOUT AND WITH CONTRAST  Technique: Multiplanar and multiecho pulse sequences of the  cervical spine, to include the craniocervical junction and  cervicothoracic junction, were obtained according to standard  protocol without and with intravenous contrast.  Contrast: 18mL MULTIHANCE GADOBENATE DIMEGLUMINE 529 MG/ML IV SOLN  Comparison: Plain films earlier in the day.  Findings: Prior C6-7 fusion appears unremarkable. Within limits of  assessment by MR, the fusion appears solid. Anterior and posterior  hardware at C6-7 result in signal drop out due to susceptibility  artifact. Marrow signal homogeneous. Visualized intracranial  compartment unremarkable. No visible neck masses.  The alignment is anatomic. Normal cord signal throughout. Except  for mild annular bulging at C4-5, no adjacent segment disease. No  evidence for disc protrusion or spinal stenosis. Following  administration of contrast, no abnormal enhancement.  IMPRESSION:  Unremarkable MRI cervical spine without with  contrast. Solid  appearing C6-7 fusion. No significant adjacent segment disease.   BMET    Component Value Date/Time   NA 136 10/28/2012 2145   K 4.0 10/28/2012 2145   CL 104 10/28/2012 2145   CO2 24 10/28/2012 2145   GLUCOSE 83 10/28/2012 2145   BUN 12 10/28/2012 2145   CREATININE 1.12 10/28/2012 2145   CALCIUM 8.7 10/28/2012 2145   GFRNONAA 75* 10/28/2012 2145   GFRAA 87* 10/28/2012 2145    CBC    Component Value Date/Time   WBC 8.2 10/28/2012 2145   RBC 4.28 10/28/2012 2145   HGB 14.4 10/28/2012 2145   HCT 41.5 10/28/2012 2145   PLT 230 10/28/2012 2145   MCV 97.0 10/28/2012 2145   MCH 33.6 10/28/2012 2145   MCHC 34.7 10/28/2012 2145   RDW 13.0 10/28/2012 2145   LYMPHSABS 3.1 10/28/2012 2145   MONOABS 0.5 10/28/2012 2145   EOSABS 0.2 10/28/2012 2145   BASOSABS 0.1 10/28/2012 2145    Lipid Panel     Component Value Date/Time   CHOL 156 10/29/2012 0300   TRIG 186* 10/29/2012 0300   HDL 52 10/29/2012 0300   CHOLHDL 3.0 10/29/2012 0300   VLDL 37 10/29/2012 0300   LDLCALC 67 10/29/2012 0300     Discharge Exam: Blood pressure 115/87, pulse 65, temperature 98.4 F (36.9 C), temperature source Oral, resp. rate 20, height 5\' 9"  (1.753 m), weight 90.81 kg (200 lb 3.2 oz), SpO2 99.00%.   Disposition: 01-Home or Self Care  Discharge Orders    Future Orders Please Complete By Expires   Diet - low sodium heart healthy      Increase activity slowly          Medication List     As of 10/29/2012  5:44 PM    TAKE these medications         aspirin 81 MG EC tablet   Take 1 tablet (81 mg total) by mouth daily.      enalapril 10 MG tablet   Commonly known as: VASOTEC   Take 1 tablet (10 mg total) by mouth daily.      nicotine 21 mg/24hr patch   Commonly known as: NICODERM CQ - dosed in mg/24 hours   Place 1 patch onto the skin daily.      nicotine 14 mg/24hr patch   Commonly known as: NICODERM CQ - dosed in mg/24 hours   Place 1 patch onto the skin daily.      nicotine 7 mg/24hr  patch   Commonly known as: NICODERM CQ - dosed in mg/24 hr   Place 1 patch onto the skin daily.         SignedWilburt Finlay 10/29/2012, 5:44 PM

## 2014-03-18 ENCOUNTER — Encounter (HOSPITAL_COMMUNITY): Payer: Self-pay | Admitting: Emergency Medicine

## 2014-03-18 ENCOUNTER — Emergency Department (HOSPITAL_COMMUNITY)
Admission: EM | Admit: 2014-03-18 | Discharge: 2014-03-18 | Disposition: A | Discharge status: Home / Self Care | Payer: Self-pay | Attending: Emergency Medicine | Admitting: Emergency Medicine

## 2014-03-18 ENCOUNTER — Emergency Department (HOSPITAL_COMMUNITY): Payer: Self-pay

## 2014-03-18 DIAGNOSIS — F17200 Nicotine dependence, unspecified, uncomplicated: Secondary | ICD-10-CM | POA: Insufficient documentation

## 2014-03-18 DIAGNOSIS — Z9889 Other specified postprocedural states: Secondary | ICD-10-CM | POA: Insufficient documentation

## 2014-03-18 DIAGNOSIS — R079 Chest pain, unspecified: Secondary | ICD-10-CM | POA: Insufficient documentation

## 2014-03-18 DIAGNOSIS — F10929 Alcohol use, unspecified with intoxication, unspecified: Secondary | ICD-10-CM

## 2014-03-18 DIAGNOSIS — F1010 Alcohol abuse, uncomplicated: Secondary | ICD-10-CM | POA: Insufficient documentation

## 2014-03-18 DIAGNOSIS — I10 Essential (primary) hypertension: Secondary | ICD-10-CM | POA: Insufficient documentation

## 2014-03-18 DIAGNOSIS — M542 Cervicalgia: Secondary | ICD-10-CM | POA: Insufficient documentation

## 2014-03-18 DIAGNOSIS — G8929 Other chronic pain: Secondary | ICD-10-CM | POA: Insufficient documentation

## 2014-03-18 LAB — I-STAT TROPONIN, ED
TROPONIN I, POC: 0 ng/mL (ref 0.00–0.08)
Troponin i, poc: 0 ng/mL (ref 0.00–0.08)

## 2014-03-18 LAB — COMPREHENSIVE METABOLIC PANEL
ALK PHOS: 67 U/L (ref 39–117)
ALT: 37 U/L (ref 0–53)
AST: 37 U/L (ref 0–37)
Albumin: 4.1 g/dL (ref 3.5–5.2)
BILIRUBIN TOTAL: 0.4 mg/dL (ref 0.3–1.2)
BUN: 8 mg/dL (ref 6–23)
CHLORIDE: 99 meq/L (ref 96–112)
CO2: 19 mEq/L (ref 19–32)
CREATININE: 0.87 mg/dL (ref 0.50–1.35)
Calcium: 9 mg/dL (ref 8.4–10.5)
GLUCOSE: 89 mg/dL (ref 70–99)
POTASSIUM: 4.6 meq/L (ref 3.7–5.3)
Sodium: 138 mEq/L (ref 137–147)
Total Protein: 7.5 g/dL (ref 6.0–8.3)

## 2014-03-18 LAB — CBC
HEMATOCRIT: 44.9 % (ref 39.0–52.0)
HEMOGLOBIN: 16.4 g/dL (ref 13.0–17.0)
MCH: 35 pg — ABNORMAL HIGH (ref 26.0–34.0)
MCHC: 36.5 g/dL — AB (ref 30.0–36.0)
MCV: 95.7 fL (ref 78.0–100.0)
Platelets: 154 10*3/uL (ref 150–400)
RBC: 4.69 MIL/uL (ref 4.22–5.81)
RDW: 13.6 % (ref 11.5–15.5)
WBC: 10.9 10*3/uL — AB (ref 4.0–10.5)

## 2014-03-18 LAB — APTT: APTT: 31 s (ref 24–37)

## 2014-03-18 LAB — URINALYSIS, ROUTINE W REFLEX MICROSCOPIC
BILIRUBIN URINE: NEGATIVE
GLUCOSE, UA: NEGATIVE mg/dL
HGB URINE DIPSTICK: NEGATIVE
KETONES UR: NEGATIVE mg/dL
Leukocytes, UA: NEGATIVE
Nitrite: NEGATIVE
PROTEIN: NEGATIVE mg/dL
Specific Gravity, Urine: 1.009 (ref 1.005–1.030)
Urobilinogen, UA: 0.2 mg/dL (ref 0.0–1.0)
pH: 5.5 (ref 5.0–8.0)

## 2014-03-18 LAB — PROTIME-INR
INR: 1 (ref 0.00–1.49)
PROTHROMBIN TIME: 13 s (ref 11.6–15.2)

## 2014-03-18 LAB — ETHANOL: Alcohol, Ethyl (B): 265 mg/dL — ABNORMAL HIGH (ref 0–11)

## 2014-03-18 MED ORDER — NITROGLYCERIN 0.4 MG SL SUBL
0.4000 mg | SUBLINGUAL_TABLET | SUBLINGUAL | Status: DC | PRN
  Administered 2014-03-18 (×2): 0.4 mg via SUBLINGUAL
  Filled 2014-03-18: qty 1

## 2014-03-18 MED ORDER — ASPIRIN 81 MG PO CHEW
324.0000 mg | CHEWABLE_TABLET | Freq: Once | ORAL | Status: AC
  Administered 2014-03-18: 324 mg via ORAL
  Filled 2014-03-18: qty 4

## 2014-03-18 NOTE — Discharge Planning (Signed)
[facility] Community Liaison  Patient was seen in the ED last night, my contact information was provide for the patient. Patients son called this morning requesting information about the orange card. Application and resources will be sent to the address listed. No other needs expressed at this time.

## 2014-03-18 NOTE — ED Notes (Signed)
MD aware of pt's request for pain medication.

## 2014-03-18 NOTE — ED Provider Notes (Signed)
CSN: [id number]     Arrival date & time 03/18/14  0215 History   First MD Initiated Contact with Patient 03/18/14 [phone]     Chief Complaint  Patient presents with  . Chest Pain     (Consider location/radiation/quality/duration/timing/severity/associated sxs/prior Treatment) HPI Comments: 53 year old male, history of hypertension he states that he did not take his medication because of cost, history of heavy smoking as well as history of alcohol abuse. He states that he drinks more than a 12 pack of beer daily. He has been out drinking this evening, he has been feeling anxious and stressed about his family who he does not get along with including his ex-wife and his children. He states that he has had persistent chest pain for 2 months, he also complains of having hematuria after he exerts himself, he does not take blood thinners. He has chronic back and neck pain and has had surgery on his neck in the past. At this time the patient is tearful and seems to be more concerned about urinating blood every so often and that he does about his chest pain. He denies associated diaphoresis but states his appetite is poor and he has chronic nausea, and shortness of breath constantly. His left face is numb constantly. He does endorse having a stroke in the past. Review of the medical record shows that the patient had been admitted to the hospital in January of 2014 at which time he was on the cardiology service, had a lexiscan Myoview stress test revealing no inducible ischemia and ejection fraction over 50%. The patient was discharged with noncardiac chest pain. He readily admit that he is noncompliant with his anti-hypertensives.    Patient is a 53 y.o. male presenting with chest pain. The history is provided by the patient and the EMS personnel.  Chest Pain   Past Medical History  Diagnosis Date  . Hypertension     put on ACE 6-8wks ago, ran out   Past Surgical History  Procedure Laterality Date  .  Neck surgery     Family History  Problem Relation Age of Onset  . Coronary artery disease Father 2    MI   History  Substance Use Topics  . Smoking status: Current Every Day Smoker  . Smokeless tobacco: Not on file  . Alcohol Use: Yes    Review of Systems  Cardiovascular: Positive for chest pain.  All other systems reviewed and are negative.     Allergies  Lisinopril  Home Medications   Prior to Admission medications   Not on File   BP 121/84  Pulse 92  Temp(Src) 97.7 F (36.5 C) (Oral)  Resp 17  SpO2 96% Physical Exam  Nursing note and vitals reviewed. Constitutional: He appears well-developed and well-nourished. No distress.  HENT:  Head: Normocephalic and atraumatic.  Mouth/Throat: Oropharynx is clear and moist. No oropharyngeal exudate.  Eyes: Conjunctivae and EOM are normal. Pupils are equal, round, and reactive to light. Right eye exhibits no discharge. Left eye exhibits no discharge. No scleral icterus.  Neck: Normal range of motion. Neck supple. No JVD present. No thyromegaly present.  Cardiovascular: Normal rate, regular rhythm, normal heart sounds and intact distal pulses.  Exam reveals no gallop and no friction rub.   No murmur heard. Pulmonary/Chest: Effort normal and breath sounds normal. No respiratory distress. He has no wheezes. He has no rales.  Abdominal: Soft. Bowel sounds are normal. He exhibits no distension and no mass. There is no tenderness.  Musculoskeletal: Normal range of motion. He exhibits no edema and no tenderness.  Lymphadenopathy:    He has no cervical adenopathy.  Neurological: He is alert. Coordination normal.  Skin: Skin is warm and dry. No rash noted. No erythema.  Psychiatric:  Tearful, no hallucinations or suicidal thougths    ED Course  Procedures (including critical care time) Labs Review Labs Reviewed  CBC - Abnormal; Notable for the following:    WBC 10.9 (*)    MCH 35.0 (*)    MCHC 36.5 (*)    All other  components within normal limits  ETHANOL - Abnormal; Notable for the following:    Alcohol, Ethyl (B) 265 (*)    All other components within normal limits  APTT  COMPREHENSIVE METABOLIC PANEL  PROTIME-INR  URINALYSIS, ROUTINE W REFLEX MICROSCOPIC  I-STAT TROPOININ, ED  I-STAT TROPOININ, ED    Imaging Review Dg Chest Portable 1 View  03/18/2014   CLINICAL DATA:  Chest pain  EXAM: PORTABLE CHEST - 1 VIEW  COMPARISON:  10/28/2012  FINDINGS: Chronic pulmonary hyperinflation. Normal heart size and mediastinal contours. No edema, consolidation, effusion, or pneumothorax. Cervicothoracic anterior discectomy. Lower cervical posterior fusion.  IMPRESSION: No active disease.   Electronically Signed   By: Tiburcio PeaJonathan  Watts M.D.   On: 03/18/2014 02:50     EKG Interpretation   Date/Time:  Thursday March 18 2014 05:55:43 EDT Ventricular Rate:  82 PR Interval:  191 QRS Duration: 112 QT Interval:  380 QTC Calculation: 444 R Axis:   -57 Text Interpretation:  Sinus rhythm Left anterior fascicular block RSR' in  V1 or V2, right VCD or RVH Nonspecific ST abnormality Abnormal ekg Since  last tracing T wave abnormality less prominent Confirmed by Kaitlyn Skowron  MD,  Deronte Solis ([id number]) on 03/18/2014 6:41:19 AM      MDM   Final diagnoses:  Chest pain  Alcohol intoxication    The pt is intoxicated - his trop is normal and ECG initially showed some trend to possible peaked T waves but no concerning ST elevation.  The repeat ECG is normal and same compared to prior.  UA pending, normal appearing clear urine - repeat trop pending - doubt acute ischemia given chronic nature and sound of the pt's symptoms.  ED ECG REPORT  I personally interpreted this EKG   Date: 03/18/2014  (1st ECG done on arrival)  Rate: 82  Rhythm: normal sinus rhythm  QRS Axis: left  Intervals: normal  ST/T Wave abnormalities: normal  Conduction Disutrbances:nonspecific intraventricular conduction delay  Narrative Interpretation:   Old EKG  Reviewed: unchanged  Repeat ECG without acute findings, I have had a long d/w pt re: alcohol abuse and his need to f/u with his PMD in [place] - seeing the Urologist - he is in agreement - doubt cardiac source.  Repeat trop normal.  Meds given in ED:  Medications  nitroGLYCERIN (NITROSTAT) SL tablet 0.4 mg (0.4 mg Sublingual Given 03/18/14 0254)  aspirin chewable tablet 324 mg (324 mg Oral Given 03/18/14 0238)      Vida RollerBrian D Jaelen Gellerman, MD 03/18/14 [phone]

## 2014-03-18 NOTE — ED Notes (Signed)
Per EMS pt is A&O X1. Pt c/o non-radiating chest pain that has been ongoing for two months, pt c.o SOB, lightheadedness, nausea. Pt's family member at home called 911, pt came home from the bar tonight and family member reports pt has been drinking all day. EMS reports pt also has nasal congestion, chest pain hurts worse if you touch his chest. NSR on monitor for EMS. EMS reports hx of chronic back and neck pain also.  178/98, 95 HR, 16 RR, 99 RA/ CBG 75

## 2014-03-18 NOTE — Discharge Instructions (Signed)
Your blood pressure has been normal, your heart tests have been normal and you have no blood in your urine.  Please have Dr. Phillips OdorGolding recheck you in the next week - if you want help with your alcohol abuse you can pursue outpatient therapy - see the phone numbers below  Please call your doctor for a followup appointment within 24-48 hours. When you talk to your doctor please let them know that you were seen in the emergency department and have them acquire all of your records so that they can discuss the findings with you and formulate a treatment plan to fully care for your new and ongoing problems.   Behavioral Health Resources in the Community: Intensive Outpatient Programs Organization         Address  Phone  Notes  [facility] [address], [place], [place] [phone]   [facility] [address], [place], [place] [phone]   [facility] [address], [place], [place]  [phone]   [facility] [address],  [place], [place] [phone] or [phone]   Substance Abuse Resources Organization         Address  Phone  Notes  [facility]  [phone]   [facility]  [phone]   [facility]  [phone]   [facility]  [phone]   Residential & Outpatient Substance Abuse Program  [phone]   Psychological Services Organization         Address  Phone  Notes  [facility]  [phone]- [phone]   [facility]  [phone]- [phone]   [facility] [address], [place] [phone] or [phone]    Mobile Crisis Teams Organization         Address  Phone  Notes  [facility], Mobile Crisis Care Unit  [phone]   [facility]  [address] [place], [place] [phone]   Doristine LocksSharon DeEsch [address] [place] [phone]    Self-Help/Support Groups Organization          Address  Phone             Notes  [facility] [place] - variety of support groups  [phone] Call for more information  Narcotics Anonymous (NA), [facility] [address], [place]  2 meetings at this location   Statisticianesidential Treatment Programs Organization         Address  Phone  Notes  [facility] [address],    [place]  [phone]   [facility]  [address], [place] [phone], [place], [place] [phone]   [facility] [address], [place] [phone] Admissions: 8am-3pm M-F  [facility] [address]    [place], [place] [phone]   [facility] [address], [place], [place] [phone]   [facility] [address]  [place], [place] [phone]   [facility] - Intensive Outpatient [address], [place], [place] [phone]   [facility] ([facility] [address]  [place], [place] [phone] or [phone]   [facility] ([facility]) [address] [place], [place] [phone] Accepts Medicaid  [facility] [address]  [place] [phone] Substance Abuse/Addiction Treatment   [facility] Resources Organization         Address  Phone  Notes  [facility]  [phone]   Angie Fava, PhD [address] [place], [place]   [phone] or [phone]   [facility]   [address] [place], [place] [phone]   [facility] [address], [place], [place] [phone] Insurance/Medicaid/sponsorship through [facility] [address]                                    [place], [place] [phone] Therapy/tele-psych/case  [facility] [address], [place] [phone]    Dr. Lolly Mustache  [phone]   [facility]  [employer]  [facility] Dept. 1) [address], [place] 2) [address] [place] 3)  [address], [place] [phone]  [phone]   [facility] Child Abuse Hotline [phone] or [phone] (After Hours)

## 2014-03-18 NOTE — ED Notes (Signed)
MD at bedside. 

## 2014-03-18 NOTE — ED Notes (Signed)
EKG hand delivered to Dr. Miller. 

## 2018-01-13 ENCOUNTER — Encounter: Payer: Self-pay | Admitting: Orthopedic Surgery

## 2018-01-13 ENCOUNTER — Ambulatory Visit (INDEPENDENT_AMBULATORY_CARE_PROVIDER_SITE_OTHER): Payer: Medicare Other | Admitting: Orthopedic Surgery

## 2018-01-13 ENCOUNTER — Ambulatory Visit (INDEPENDENT_AMBULATORY_CARE_PROVIDER_SITE_OTHER): Payer: Medicare Other

## 2018-01-13 VITALS — BP 161/93 | HR 100 | Ht 71.0 in | Wt 190.0 lb

## 2018-01-13 DIAGNOSIS — Q741 Congenital malformation of knee: Secondary | ICD-10-CM | POA: Diagnosis not present

## 2018-01-13 DIAGNOSIS — M25561 Pain in right knee: Secondary | ICD-10-CM | POA: Diagnosis not present

## 2018-01-13 DIAGNOSIS — G8929 Other chronic pain: Secondary | ICD-10-CM

## 2018-01-13 MED ORDER — DICLOFENAC SODIUM 75 MG PO TBEC
75.0000 mg | DELAYED_RELEASE_TABLET | Freq: Two times a day (BID) | ORAL | 2 refills | Status: DC
Start: 2018-01-13 — End: 2018-11-04

## 2018-01-13 NOTE — Progress Notes (Signed)
NEW PATIENT OFFICE VISIT   Chief Complaint  Patient presents with  . Knee Pain    Right knee pain, no injury.    57 year old male presents with a long history of right knee pain which is worsened over the last few months.  He complains of throbbing pain when he is lying on his back at night and he has intermittent swelling and throbbing pain when he is up on it for long periods of time.  He has no loss of motion catching locking or giving way he describes the pain is severe he has not been treated with any medicine  He has a history of a pinched nerve in the right side of his lower back   Review of Systems  Constitutional: Negative.   Respiratory: Negative.   Cardiovascular: Negative.   Neurological: Positive for tingling.     Past Medical History:  Diagnosis Date  . Hypertension    put on ACE 6-8wks ago, ran out    Past Surgical History:  Procedure Laterality Date  . NECK SURGERY      Family History  Problem Relation Age of Onset  . Coronary artery disease Father 5561       MI   Social History   Tobacco Use  . Smoking status: Current Every Day Smoker  Substance Use Topics  . Alcohol use: Yes  . Drug use: No    No outpatient medications have been marked as taking for the 01/13/18 encounter (Office Visit) with Vickki HearingHarrison, Kain Milosevic E, MD.    BP (!) 161/93   Pulse 100   Ht 5\' 11"  (1.803 m)   Wt 190 lb (86.2 kg)   BMI 26.50 kg/m   Physical Exam  Constitutional: He is oriented to person, place, and time. He appears well-developed and well-nourished.  Vital signs have been reviewed and are stable. Gen. appearance the patient is well-developed and well-nourished with normal grooming and hygiene.   Musculoskeletal:       Right knee: He exhibits no effusion.  Neurological: He is alert and oriented to person, place, and time. He displays no atrophy. Coordination and gait normal.  Posterior right leg pain with straight leg raise but not radicular in nature although none  was noted on the left  Skin: Skin is warm and dry. No erythema.  Psychiatric: He has a normal mood and affect.  Vitals reviewed.   Right Knee Exam   Muscle Strength  The patient has normal right knee strength.  Tenderness  The patient is experiencing tenderness in the patella and medial joint line.  Range of Motion  The patient has normal right knee ROM.  Tests  McMurray:  Medial - negative Lateral - negative Varus: negative Valgus: negative Lachman:  Anterior - negative     Drawer:  Anterior - negative    Posterior - negative Patellar apprehension: negative  Other  Erythema: absent Sensation: normal Pulse: present Swelling: none Effusion: no effusion present   Left Knee Exam   Range of Motion  The patient has normal left knee ROM.  Other  Erythema: absent Scars: absent Sensation: normal Pulse: present Swelling: none      MEDICAL DECISION SECTION  xrays ordered? 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  . Chronic pain of right knee Yes  . Bipartite patella      PLAN:   Meds ordered this encounter  Medications  . diclofenac (VOLTAREN) 75 MG EC tablet    Sig: Take 1 tablet (75 mg total) by mouth 2 (two) times daily with a meal.    Dispense:  60 tablet    Refill:  2   Injection? no MRI/CT/? No  Recommend oral medication for 6 weeks then recheck if no improvement try injection

## 2018-02-11 ENCOUNTER — Other Ambulatory Visit: Payer: Self-pay

## 2018-02-11 ENCOUNTER — Encounter (HOSPITAL_COMMUNITY): Payer: Self-pay | Admitting: Emergency Medicine

## 2018-02-11 ENCOUNTER — Emergency Department (HOSPITAL_COMMUNITY)
Admission: EM | Admit: 2018-02-11 | Discharge: 2018-02-11 | Disposition: A | Discharge status: Home / Self Care | Payer: Medicare Other | Attending: Emergency Medicine | Admitting: Emergency Medicine

## 2018-02-11 DIAGNOSIS — Z5321 Procedure and treatment not carried out due to patient leaving prior to being seen by health care provider: Secondary | ICD-10-CM | POA: Insufficient documentation

## 2018-02-11 DIAGNOSIS — R1030 Lower abdominal pain, unspecified: Secondary | ICD-10-CM | POA: Diagnosis present

## 2018-02-11 NOTE — ED Triage Notes (Signed)
Pt reports testicle swelling intermittently x2 years. Pt reports is able to void and reports groin pain. Pt denies any known injury,fever, abd pain.

## 2018-02-11 NOTE — ED Triage Notes (Addendum)
Pt with elevated BP, hasn't taken HTN medication in over a year.

## 2018-02-19 ENCOUNTER — Ambulatory Visit: Payer: Medicare Other | Admitting: Orthopedic Surgery

## 2018-02-24 ENCOUNTER — Ambulatory Visit: Payer: Medicare Other | Admitting: Orthopedic Surgery

## 2018-02-24 ENCOUNTER — Encounter: Payer: Self-pay | Admitting: Orthopedic Surgery

## 2018-10-24 ENCOUNTER — Emergency Department (HOSPITAL_COMMUNITY): Payer: Medicare Other

## 2018-10-24 ENCOUNTER — Other Ambulatory Visit: Payer: Self-pay

## 2018-10-24 ENCOUNTER — Emergency Department (HOSPITAL_COMMUNITY)
Admission: EM | Admit: 2018-10-24 | Discharge: 2018-10-24 | Disposition: A | Discharge status: Home / Self Care | Payer: Medicare Other | Attending: Emergency Medicine | Admitting: Emergency Medicine

## 2018-10-24 ENCOUNTER — Encounter (HOSPITAL_COMMUNITY): Payer: Self-pay | Admitting: *Deleted

## 2018-10-24 DIAGNOSIS — Y9301 Activity, walking, marching and hiking: Secondary | ICD-10-CM | POA: Insufficient documentation

## 2018-10-24 DIAGNOSIS — Y999 Unspecified external cause status: Secondary | ICD-10-CM | POA: Diagnosis not present

## 2018-10-24 DIAGNOSIS — S82831A Other fracture of upper and lower end of right fibula, initial encounter for closed fracture: Secondary | ICD-10-CM | POA: Diagnosis not present

## 2018-10-24 DIAGNOSIS — S99911A Unspecified injury of right ankle, initial encounter: Secondary | ICD-10-CM | POA: Diagnosis present

## 2018-10-24 DIAGNOSIS — W108XXA Fall (on) (from) other stairs and steps, initial encounter: Secondary | ICD-10-CM | POA: Diagnosis not present

## 2018-10-24 DIAGNOSIS — Y929 Unspecified place or not applicable: Secondary | ICD-10-CM | POA: Diagnosis not present

## 2018-10-24 MED ORDER — ONDANSETRON HCL 4 MG PO TABS
4.0000 mg | ORAL_TABLET | Freq: Once | ORAL | Status: AC
  Administered 2018-10-24: 4 mg via ORAL
  Filled 2018-10-24: qty 1

## 2018-10-24 MED ORDER — KETOROLAC TROMETHAMINE 10 MG PO TABS
10.0000 mg | ORAL_TABLET | Freq: Once | ORAL | Status: AC
  Administered 2018-10-24: 10 mg via ORAL
  Filled 2018-10-24: qty 1

## 2018-10-24 MED ORDER — HYDROCODONE-ACETAMINOPHEN 5-325 MG PO TABS: 1.0000 | ORAL_TABLET | ORAL | 0 refills | Status: DC | PRN

## 2018-10-24 MED ORDER — HYDROCODONE-ACETAMINOPHEN 5-325 MG PO TABS
2.0000 | ORAL_TABLET | Freq: Once | ORAL | Status: AC
  Administered 2018-10-24: 2 via ORAL
  Filled 2018-10-24: qty 2

## 2018-10-24 MED ORDER — IBUPROFEN 600 MG PO TABS: 600.0000 mg | ORAL_TABLET | Freq: Four times a day (QID) | ORAL | 0 refills | Status: DC

## 2018-10-24 NOTE — Discharge Instructions (Addendum)
You have a fracture of the fibula of the right lower extremity.  Please keep your ankle elevated above your waist when sitting and above your heart when lying down.  Please use 600 mg of ibuprofen with breakfast, lunch, dinner, and at bedtime.  Please use Norco every 4 hours if needed for more severe pain.  Please call Dr. Romeo Apple for orthopedic evaluation as soon as possible.  Please use your cam walker and crutches when up and about.

## 2018-10-24 NOTE — ED Triage Notes (Signed)
Pt c/o right ankle pain after fall 1 week ago when he slipped going up some muddy stairs.

## 2018-10-24 NOTE — ED Provider Notes (Signed)
[facility] Provider Note   CSN: [id number] Arrival date & time: 10/24/18  1433     History   Chief Complaint Chief Complaint  Patient presents with  . Ankle Pain    HPI David Watson is a 58 y.o. male.  The history is provided by the patient.  Ankle Pain  Location:  Ankle Time since incident:  1 week Injury: yes   Mechanism of injury: fall   Fall:    Fall occurred:  Down stairs   Entrapped after fall: no   Ankle location:  R ankle Pain details:    Quality:  Aching and throbbing   Radiates to:  Does not radiate   Severity:  Moderate   Onset quality:  Sudden   Duration:  1 week   Timing:  Constant   Progression:  Worsening Dislocation: no   Prior injury to area:  No Relieved by:  Nothing Worsened by:  Bearing weight Ineffective treatments:  Elevation Associated symptoms: decreased ROM   Associated symptoms: no back pain, no neck pain and no numbness     Past Medical History:  Diagnosis Date  . Hypertension    put on ACE 6-8wks ago, ran out    Patient Active Problem List   Diagnosis Date Noted  . Chest pain with moderate risk for cardiac etiology 10/28/2012  . Smoking 10/28/2012  . Family history of coronary artery disease 10/28/2012  . DJD (degenerative joint disease), C- spine surgury X 2 10/28/2012  . CARPAL TUNNEL SYNDROME 01/25/2009  . ARM NUMBNESS 01/06/2009    Past Surgical History:  Procedure Laterality Date  . NECK SURGERY          Home Medications    Prior to Admission medications   Medication Sig Start Date End Date Taking? Authorizing Provider  diclofenac (VOLTAREN) 75 MG EC tablet Take 1 tablet (75 mg total) by mouth 2 (two) times daily with a meal. 01/13/18   Vickki HearingHarrison, Stanley E, MD    Family History Family History  Problem Relation Age of Onset  . Coronary artery disease Father [id number]       MI    Social History Social History   Tobacco Use  . Smoking status: Current Every Day Smoker    Packs/day:  0.50  . Smokeless tobacco: Never Used  Substance Use Topics  . Alcohol use: Yes    Comment: occ  . Drug use: No     Allergies   Lisinopril   Review of Systems Review of Systems  Constitutional: Negative for activity change.       All ROS Neg except as noted in HPI  HENT: Negative for nosebleeds.   Eyes: Negative for photophobia and discharge.  Respiratory: Negative for cough, shortness of breath and wheezing.   Cardiovascular: Negative for chest pain and palpitations.  Gastrointestinal: Negative for abdominal pain and blood in stool.  Genitourinary: Negative for dysuria, frequency and hematuria.  Musculoskeletal: Negative for arthralgias, back pain and neck pain.  Skin: Negative.   Neurological: Negative for dizziness, seizures and speech difficulty.  Psychiatric/Behavioral: Negative for confusion and hallucinations.     Physical Exam Updated Vital Signs BP (!) 135/94 (BP Location: Right Arm)   Pulse 94   Temp 98 F (36.7 C) (Oral)   Resp 20   Ht 5\' 6"  (1.676 m)   Wt 83.9 kg   SpO2 98%   BMI 29.86 kg/m   Physical Exam Vitals signs and nursing note reviewed.  Constitutional:  Appearance: He is well-developed. He is not toxic-appearing.  HENT:     Head: Normocephalic.     Right Ear: Tympanic membrane and external ear normal.     Left Ear: Tympanic membrane and external ear normal.  Eyes:     General: Lids are normal.     Pupils: Pupils are equal, round, and reactive to light.  Neck:     Musculoskeletal: Normal range of motion and neck supple.     Vascular: No carotid bruit.  Cardiovascular:     Rate and Rhythm: Normal rate and regular rhythm.     Pulses: Normal pulses.     Heart sounds: Normal heart sounds.  Pulmonary:     Effort: No respiratory distress.     Breath sounds: Normal breath sounds.  Abdominal:     General: Bowel sounds are normal.     Palpations: Abdomen is soft.     Tenderness: There is no abdominal tenderness. There is no guarding.    Musculoskeletal:     Right ankle: He exhibits decreased range of motion and swelling. Tenderness. Lateral malleolus tenderness found. Achilles tendon normal.       Feet:  Lymphadenopathy:     Head:     Right side of head: No submandibular adenopathy.     Left side of head: No submandibular adenopathy.     Cervical: No cervical adenopathy.  Skin:    General: Skin is warm and dry.  Neurological:     Mental Status: He is alert and oriented to person, place, and time.     Cranial Nerves: No cranial nerve deficit.     Sensory: No sensory deficit.  Psychiatric:        Speech: Speech normal.      ED Treatments / Results  Labs (all labs ordered are listed, but only abnormal results are displayed) Labs Reviewed - No data to display  EKG None  Radiology Dg Ankle Complete Right  Result Date: 10/24/2018 CLINICAL DATA:  Right ankle pain since fall 1 week ago. EXAM: RIGHT ANKLE - COMPLETE 3+ VIEW COMPARISON:  None. FINDINGS: Nondisplaced, oblique fracture of the distal fibular metadiaphysis. No additional fracture. No dislocation. The ankle mortise is symmetric. The talar dome is intact. No tibiotalar joint effusion. Joint spaces are preserved. Bone mineralization is normal. Small Achilles and plantar enthesophytes. Mild soft tissue swelling along the lateral ankle. IMPRESSION: 1. Nondisplaced oblique fracture of the distal fibula. Electronically Signed   By: Obie Dredge M.D.   On: 10/24/2018 15:51    Procedures Procedures (including critical care time)  FRACTURE CARE RIGHT ANKLE.  Patient is a 58 year old male who slipped down some many steps in his home area.  He sustained an injury to the ankle.  He thought it would get better but instead it got progressively worse.  X-ray shows an oblique nondisplaced fracture of the distal fibula.  I discussed the fracture with the patient in terms which he understands.  I discussed the need for immobilization.  The patient gives permission for  the procedure.  The patient was identified by armband.  Procedural timeout was taken.  The patient was fitted with an Ace wrap to the right ankle as well as a cam walker.  The patient was fitted with crutches by nursing staff.  The patient was instructed to keep the leg elevated when at rest.  Crutches were given to the patient to use when he is up and about.  Patient given medication for pain.  Patient tolerated  the procedure without problem. Medications Ordered in ED Medications  ketorolac (TORADOL) tablet 10 mg (has no administration in time range)  HYDROcodone-acetaminophen (NORCO/VICODIN) 5-325 MG per tablet 2 tablet (has no administration in time range)  ondansetron (ZOFRAN) tablet 4 mg (has no administration in time range)     Initial Impression / Assessment and Plan / ED Course  I have reviewed the triage vital signs and the nursing notes.  Pertinent labs & imaging results that were available during my care of the patient were reviewed by me and considered in my medical decision making (see chart for details).       Final Clinical Impressions(s) / ED Diagnoses MDM  Patient sustained a fall when he slipped on a muddy set of stairs approximately a week ago he has been having problems since that time.  He complains of pain of the right ankle extending into the foot foot.  There are no neurologic or vascular deficits appreciated but there is swelling and tenderness along the lateral malleolus extending up over the fibular area.  X-ray shows nondisplaced oblique fracture of the distal fibula.  Patient fitted with an Ace wrap and cam walker and crutches.  Pain medication provided.  The patient will follow-up with Dr. Romeo AppleHarrison for orthopedic management.  Patient is in agreement with this plan.   Final diagnoses:  Traumatic closed nondisplaced fracture of distal fibula, right, initial encounter    ED Discharge Orders         Ordered    ibuprofen (ADVIL,MOTRIN) 600 MG tablet  4  times daily     10/24/18 1628    HYDROcodone-acetaminophen (NORCO/VICODIN) 5-325 MG tablet  Every 4 hours PRN     10/24/18 1628           Ivery QualeBryant, Almus Woodham, PA-C 10/25/18 1853    Donnetta Hutchingook, Brian, MD 10/25/18 2003

## 2018-11-04 ENCOUNTER — Ambulatory Visit (INDEPENDENT_AMBULATORY_CARE_PROVIDER_SITE_OTHER): Payer: Medicare Other

## 2018-11-04 ENCOUNTER — Encounter (INDEPENDENT_AMBULATORY_CARE_PROVIDER_SITE_OTHER): Payer: Self-pay | Admitting: Orthopaedic Surgery

## 2018-11-04 ENCOUNTER — Ambulatory Visit (INDEPENDENT_AMBULATORY_CARE_PROVIDER_SITE_OTHER): Payer: Medicare Other | Admitting: Orthopaedic Surgery

## 2018-11-04 DIAGNOSIS — S82831A Other fracture of upper and lower end of right fibula, initial encounter for closed fracture: Secondary | ICD-10-CM

## 2018-11-04 MED ORDER — HYDROCODONE-ACETAMINOPHEN 5-325 MG PO TABS: 1.0000 | ORAL_TABLET | Freq: Three times a day (TID) | ORAL | 0 refills | Status: DC | PRN

## 2018-11-04 NOTE — Progress Notes (Signed)
Office Visit Note   Patient: David Watson           Date of Birth: [birth year]           MRN: [id number] Visit Date: 11/04/2018              Requested by: No referring provider defined for this encounter. PCP: Patient, No Pcp Per   Assessment & Plan: Visit Diagnoses:  1. Other closed fracture of distal end of right fibula, initial encounter     Plan: Impression is right distal fibula fracture.  This should be amenable to conservative treatment. He will continue to wear his cam walker. I have reinforced that the patient needs to really be nonweightbearing for the next 4 weeks.  He will continue to elevate for pain and swelling.  I have refilled his Norco but told him should he need further pain medicine we will have to wean to tramadol.  Follow-up with [place] in 4 weeks time for repeat evaluation and 3 view x-rays of the right ankle  Follow-Up Instructions: Return in about 4 weeks (around 12/02/2018).   Orders:  Orders Placed This Encounter  Procedures  . XR Ankle Complete Right   Meds ordered this encounter  Medications  . HYDROcodone-acetaminophen (NORCO) 5-325 MG tablet    Sig: Take 1 tablet by mouth 3 (three) times daily as needed for moderate pain.    Dispense:  15 tablet    Refill:  0      Procedures: No procedures performed   Clinical Data: No additional findings.   Subjective: Chief Complaint  Patient presents with  . Right Ankle - Fracture    HPI patient is a pleasant 58 year old gentleman presents our clinic today 2 weeks status post right ankle injury.  Date of injury 10/24/2018.  He was carrying an 80 pound load of cement on his back going up a set of stairs when he fell inverting the right ankle.  He was seen at [facility] where x-rays were obtained but said a right minimally displaced distal fibula fracture.  He was placed in a cam walker and has been weightbearing over the past 2 weeks, despite being given crutches and told to be nonweightbearing.  He  notes moderate pain which is relieved with Norco.  He has run out of his Norco and has been taking Advil which does not seem to help.  He states that he has been elevating and icing his ankle as needed.  Review of Systems as detailed in HPI.  All others reviewed and are negative.   Objective: Vital Signs: There were no vitals taken for this visit.  Physical Exam well-developed and well-nourished gentleman in no acute distress.  Alert and oriented x3.  Ortho Exam examination of the right ankle reveals moderate swelling.  Marked tenderness over the fracture site.  Limited range of motion secondary to pain.  Calf is soft and nontender.  He has neurovascularly intact distally.  Specialty Comments:  No specialty comments available.  Imaging: Xr Ankle Complete Right  Result Date: 11/04/2018 X-rays demonstrate oblique fracture distal fibula in acceptable alignment    PMFS History: Patient Active Problem List   Diagnosis Date Noted  . Chest pain with moderate risk for cardiac etiology 10/28/2012  . Smoking 10/28/2012  . Family history of coronary artery disease 10/28/2012  . DJD (degenerative joint disease), C- spine surgury X 2 10/28/2012  . CARPAL TUNNEL SYNDROME 01/25/2009  . ARM NUMBNESS 01/06/2009   Past Medical History:  Diagnosis Date  . Hypertension    put on ACE 6-8wks ago, ran out    Family History  Problem Relation Age of Onset  . Coronary artery disease Father 38       MI    Past Surgical History:  Procedure Laterality Date  . NECK SURGERY     Social History   Occupational History  . Not on file  Tobacco Use  . Smoking status: Current Every Day Smoker    Packs/day: 0.50  . Smokeless tobacco: Never Used  Substance and Sexual Activity  . Alcohol use: Yes    Comment: occ  . Drug use: No  . Sexual activity: Not on file

## 2018-12-02 ENCOUNTER — Encounter (INDEPENDENT_AMBULATORY_CARE_PROVIDER_SITE_OTHER): Payer: Self-pay | Admitting: Orthopaedic Surgery

## 2018-12-02 ENCOUNTER — Ambulatory Visit (INDEPENDENT_AMBULATORY_CARE_PROVIDER_SITE_OTHER): Payer: Medicare Other

## 2018-12-02 ENCOUNTER — Ambulatory Visit (INDEPENDENT_AMBULATORY_CARE_PROVIDER_SITE_OTHER): Payer: Medicare Other | Admitting: Orthopaedic Surgery

## 2018-12-02 DIAGNOSIS — S82831D Other fracture of upper and lower end of right fibula, subsequent encounter for closed fracture with routine healing: Secondary | ICD-10-CM

## 2018-12-02 NOTE — Progress Notes (Signed)
Office Visit Note   Patient: David Watson           Date of Birth: November 24, 1960           MRN: [id number] Visit Date: 12/02/2018              Requested by: No referring provider defined for this encounter. PCP: Patient, No Pcp Per   Assessment & Plan: Visit Diagnoses:  1. Closed fracture of distal end of right fibula with routine healing, subsequent encounter     Plan: Impression is 6 weeks status post nondisplaced fibular fracture.  X-rays demonstrate significant callus formation and signs of healing.  At this point we will officially advance him to weight-bear as tolerated in a cam boot.  We have made a referral to outpatient physical therapy for ankle rehab.  Recheck in 6 weeks with three-view x-rays of the right ankle.  Follow-Up Instructions: Return in about 6 weeks (around 01/13/2019).   Orders:  Orders Placed This Encounter  Procedures  . XR Ankle Complete Right   No orders of the defined types were placed in this encounter.     Procedures: No procedures performed   Clinical Data: No additional findings.   Subjective: Chief Complaint  Patient presents with  . Right Ankle - Pain    David Watson is nearly 6 weeks status post nondisplaced Weber C fibula fracture.  He has been noncompliant with nonweightbearing restrictions.  He has been weightbearing in the cam boot recently.  He reports moderate pain and swelling.   Review of Systems  Constitutional: Negative.   All other systems reviewed and are negative.    Objective: Vital Signs: There were no vitals taken for this visit.  Physical Exam Vitals signs and nursing note reviewed.  Constitutional:      Appearance: He is well-developed.  Pulmonary:     Effort: Pulmonary effort is normal.  Abdominal:     Palpations: Abdomen is soft.  Skin:    General: Skin is warm.  Neurological:     Mental Status: He is alert and oriented to person, place, and time.  Psychiatric:        Behavior: Behavior normal.         Thought Content: Thought content normal.        Judgment: Judgment normal.     Ortho Exam Right ankle exam shows mild swelling without significant tenderness palpation of the fibula. Specialty Comments:  No specialty comments available.  Imaging: Xr Ankle Complete Right  Result Date: 12/02/2018 Stable alignment of fibula fracture with abundant callus formation.  Ankle mortise is congruent    PMFS History: Patient Active Problem List   Diagnosis Date Noted  . Chest pain with moderate risk for cardiac etiology 10/28/2012  . Smoking 10/28/2012  . Family history of coronary artery disease 10/28/2012  . DJD (degenerative joint disease), C- spine surgury X 2 10/28/2012  . CARPAL TUNNEL SYNDROME 01/25/2009  . ARM NUMBNESS 01/06/2009   Past Medical History:  Diagnosis Date  . Hypertension    put on ACE 6-8wks ago, ran out    Family History  Problem Relation Age of Onset  . Coronary artery disease Father 39       MI    Past Surgical History:  Procedure Laterality Date  . NECK SURGERY     Social History   Occupational History  . Not on file  Tobacco Use  . Smoking status: Current Every Day Smoker    Packs/day: 0.50  .  Smokeless tobacco: Never Used  Substance and Sexual Activity  . Alcohol use: Yes    Comment: occ  . Drug use: No  . Sexual activity: Not on file

## 2019-01-12 ENCOUNTER — Telehealth (INDEPENDENT_AMBULATORY_CARE_PROVIDER_SITE_OTHER): Payer: Self-pay

## 2019-01-12 NOTE — Telephone Encounter (Signed)
Called patient. No answer LMOM to return our call. If they return call, please ask them screening questions below. Thank you.   Do you have now or have you had in the past 7 days a fever and/or chills?   Do you have now or have you had in the past 7 days a cough?   Do you have now or have you had in the last 7 days nausea, vomiting or abdominal pain?   Have you been exposed to anyone who has tested positive for COVID-19?   Have you or anyone who lives with you traveled within the last month? 

## 2019-01-13 ENCOUNTER — Ambulatory Visit (INDEPENDENT_AMBULATORY_CARE_PROVIDER_SITE_OTHER): Payer: Medicare Other | Admitting: Orthopaedic Surgery

## 2020-04-06 ENCOUNTER — Observation Stay (HOSPITAL_COMMUNITY)
Admission: EM | Admit: 2020-04-06 | Discharge: 2020-04-07 | Disposition: A | Discharge status: Home / Self Care | Payer: Medicare Other | Attending: Internal Medicine | Admitting: Internal Medicine

## 2020-04-06 ENCOUNTER — Observation Stay (HOSPITAL_BASED_OUTPATIENT_CLINIC_OR_DEPARTMENT_OTHER): Payer: Medicare Other

## 2020-04-06 ENCOUNTER — Observation Stay (HOSPITAL_COMMUNITY): Payer: Medicare Other

## 2020-04-06 ENCOUNTER — Emergency Department (HOSPITAL_COMMUNITY): Payer: Medicare Other

## 2020-04-06 ENCOUNTER — Encounter (HOSPITAL_COMMUNITY): Payer: Self-pay | Admitting: Radiology

## 2020-04-06 ENCOUNTER — Other Ambulatory Visit: Payer: Self-pay

## 2020-04-06 DIAGNOSIS — E538 Deficiency of other specified B group vitamins: Secondary | ICD-10-CM

## 2020-04-06 DIAGNOSIS — Z720 Tobacco use: Secondary | ICD-10-CM

## 2020-04-06 DIAGNOSIS — R2990 Unspecified symptoms and signs involving the nervous system: Secondary | ICD-10-CM | POA: Diagnosis not present

## 2020-04-06 DIAGNOSIS — I639 Cerebral infarction, unspecified: Secondary | ICD-10-CM | POA: Diagnosis present

## 2020-04-06 DIAGNOSIS — F17200 Nicotine dependence, unspecified, uncomplicated: Secondary | ICD-10-CM | POA: Diagnosis not present

## 2020-04-06 DIAGNOSIS — R531 Weakness: Secondary | ICD-10-CM

## 2020-04-06 DIAGNOSIS — R2689 Other abnormalities of gait and mobility: Secondary | ICD-10-CM | POA: Diagnosis not present

## 2020-04-06 DIAGNOSIS — G459 Transient cerebral ischemic attack, unspecified: Secondary | ICD-10-CM

## 2020-04-06 DIAGNOSIS — Z888 Allergy status to other drugs, medicaments and biological substances status: Secondary | ICD-10-CM | POA: Insufficient documentation

## 2020-04-06 DIAGNOSIS — I10 Essential (primary) hypertension: Secondary | ICD-10-CM

## 2020-04-06 DIAGNOSIS — Z789 Other specified health status: Secondary | ICD-10-CM

## 2020-04-06 DIAGNOSIS — F1090 Alcohol use, unspecified, uncomplicated: Secondary | ICD-10-CM

## 2020-04-06 DIAGNOSIS — R569 Unspecified convulsions: Principal | ICD-10-CM

## 2020-04-06 DIAGNOSIS — Z20822 Contact with and (suspected) exposure to covid-19: Secondary | ICD-10-CM | POA: Insufficient documentation

## 2020-04-06 DIAGNOSIS — R4182 Altered mental status, unspecified: Secondary | ICD-10-CM | POA: Diagnosis not present

## 2020-04-06 DIAGNOSIS — Z79899 Other long term (current) drug therapy: Secondary | ICD-10-CM | POA: Insufficient documentation

## 2020-04-06 LAB — COMPREHENSIVE METABOLIC PANEL
ALT: 18 U/L (ref 0–44)
AST: 21 U/L (ref 15–41)
Albumin: 3.8 g/dL (ref 3.5–5.0)
Alkaline Phosphatase: 69 U/L (ref 38–126)
Anion gap: 11 (ref 5–15)
BUN: 12 mg/dL (ref 6–20)
CO2: 23 mmol/L (ref 22–32)
Calcium: 8.8 mg/dL — ABNORMAL LOW (ref 8.9–10.3)
Chloride: 104 mmol/L (ref 98–111)
Creatinine, Ser: 0.97 mg/dL (ref 0.61–1.24)
GFR calc Af Amer: >60 mL/min (ref >60)
GFR calc non Af Amer: >60 mL/min (ref >60)
Glucose, Bld: 123 mg/dL — ABNORMAL HIGH (ref 70–99)
Potassium: 3.6 mmol/L (ref 3.5–5.1)
Sodium: 138 mmol/L (ref 135–145)
Total Bilirubin: 0.2 mg/dL — ABNORMAL LOW (ref 0.3–1.2)
Total Protein: 7.2 g/dL (ref 6.5–8.1)

## 2020-04-06 LAB — CBC
HCT: 46.5 % (ref 39.0–52.0)
Hemoglobin: 15.5 g/dL (ref 13.0–17.0)
MCH: 34.8 pg — ABNORMAL HIGH (ref 26.0–34.0)
MCHC: 33.3 g/dL (ref 30.0–36.0)
MCV: 104.5 fL — ABNORMAL HIGH (ref 80.0–100.0)
Platelets: 300 10*3/uL (ref 150–400)
RBC: 4.45 MIL/uL (ref 4.22–5.81)
RDW: 13.2 % (ref 11.5–15.5)
WBC: 5.7 10*3/uL (ref 4.0–10.5)
nRBC: 0 % (ref 0.0–0.2)

## 2020-04-06 LAB — DIFFERENTIAL
Abs Immature Granulocytes: 0.04 10*3/uL (ref 0.00–0.07)
Basophils Absolute: 0 10*3/uL (ref 0.0–0.1)
Basophils Relative: 1 %
Eosinophils Absolute: 0.1 10*3/uL (ref 0.0–0.5)
Eosinophils Relative: 2 %
Immature Granulocytes: 1 %
Lymphocytes Relative: 24 %
Lymphs Abs: 1.4 10*3/uL (ref 0.7–4.0)
Monocytes Absolute: 0.3 10*3/uL (ref 0.1–1.0)
Monocytes Relative: 6 %
Neutro Abs: 3.8 10*3/uL (ref 1.7–7.7)
Neutrophils Relative %: 66 %

## 2020-04-06 LAB — I-STAT CHEM 8, ED
BUN: 11 mg/dL (ref 6–20)
Calcium, Ion: 1.1 mmol/L — ABNORMAL LOW (ref 1.15–1.40)
Chloride: 105 mmol/L (ref 98–111)
Creatinine, Ser: 1 mg/dL (ref 0.61–1.24)
Glucose, Bld: 122 mg/dL — ABNORMAL HIGH (ref 70–99)
HCT: 47 % (ref 39.0–52.0)
Hemoglobin: 16 g/dL (ref 13.0–17.0)
Potassium: 3.7 mmol/L (ref 3.5–5.1)
Sodium: 140 mmol/L (ref 135–145)
TCO2: 21 mmol/L — ABNORMAL LOW (ref 22–32)

## 2020-04-06 LAB — VITAMIN B12: Vitamin B-12: 112 pg/mL — ABNORMAL LOW (ref 180–914)

## 2020-04-06 LAB — ECHOCARDIOGRAM COMPLETE
Height: 66 in
Weight: 2720 oz

## 2020-04-06 LAB — HIV ANTIBODY (ROUTINE TESTING W REFLEX): HIV Screen 4th Generation wRfx: NONREACTIVE

## 2020-04-06 LAB — ETHANOL: Alcohol, Ethyl (B): 33 mg/dL — ABNORMAL HIGH (ref <10)

## 2020-04-06 LAB — URINALYSIS, ROUTINE W REFLEX MICROSCOPIC
Bilirubin Urine: NEGATIVE
Glucose, UA: NEGATIVE mg/dL
Hgb urine dipstick: NEGATIVE
Ketones, ur: NEGATIVE mg/dL
Leukocytes,Ua: NEGATIVE
Nitrite: NEGATIVE
Protein, ur: NEGATIVE mg/dL
Specific Gravity, Urine: >1.046 — ABNORMAL HIGH (ref 1.005–1.030)
pH: 5 (ref 5.0–8.0)

## 2020-04-06 LAB — PROTIME-INR
INR: 1 (ref 0.8–1.2)
Prothrombin Time: 12.3 seconds (ref 11.4–15.2)

## 2020-04-06 LAB — RAPID URINE DRUG SCREEN, HOSP PERFORMED
Amphetamines: NOT DETECTED
Barbiturates: NOT DETECTED
Benzodiazepines: NOT DETECTED
Cocaine: POSITIVE — AB
Opiates: NOT DETECTED
Tetrahydrocannabinol: POSITIVE — AB

## 2020-04-06 LAB — TSH: TSH: 1.692 u[IU]/mL (ref 0.350–4.500)

## 2020-04-06 LAB — SARS CORONAVIRUS 2 BY RT PCR (HOSPITAL ORDER, PERFORMED IN ~~LOC~~ HOSPITAL LAB): SARS Coronavirus 2: NEGATIVE

## 2020-04-06 LAB — APTT: aPTT: 25 seconds (ref 24–36)

## 2020-04-06 LAB — CBG MONITORING, ED: Glucose-Capillary: 123 mg/dL — ABNORMAL HIGH (ref 70–99)

## 2020-04-06 MED ORDER — LEVETIRACETAM IN NACL 1000 MG/100ML IV SOLN
1000.0000 mg | Freq: Once | INTRAVENOUS | Status: AC
  Administered 2020-04-06: 1000 mg via INTRAVENOUS
  Filled 2020-04-06: qty 100

## 2020-04-06 MED ORDER — ASPIRIN 300 MG RE SUPP
300.0000 mg | Freq: Every day | RECTAL | Status: DC
  Filled 2020-04-06: qty 1

## 2020-04-06 MED ORDER — CYANOCOBALAMIN 1000 MCG/ML IJ SOLN
1000.0000 ug | Freq: Every day | INTRAMUSCULAR | Status: DC
  Administered 2020-04-07: 1000 ug via INTRAMUSCULAR
  Filled 2020-04-06: qty 1

## 2020-04-06 MED ORDER — SODIUM CHLORIDE 0.9 % IV SOLN: INTRAVENOUS | Status: AC

## 2020-04-06 MED ORDER — NICOTINE 21 MG/24HR TD PT24
21.0000 mg | MEDICATED_PATCH | Freq: Every day | TRANSDERMAL | Status: DC
  Administered 2020-04-06 – 2020-04-07 (×2): 21 mg via TRANSDERMAL
  Filled 2020-04-06 (×2): qty 1

## 2020-04-06 MED ORDER — ASPIRIN 325 MG PO TABS
325.0000 mg | ORAL_TABLET | Freq: Every day | ORAL | Status: DC
  Administered 2020-04-06 – 2020-04-07 (×2): 325 mg via ORAL
  Filled 2020-04-06 (×2): qty 1

## 2020-04-06 MED ORDER — STROKE: EARLY STAGES OF RECOVERY BOOK
Freq: Once | Status: DC
  Filled 2020-04-06: qty 1

## 2020-04-06 MED ORDER — IOHEXOL 350 MG/ML SOLN
75.0000 mL | Freq: Once | INTRAVENOUS | Status: AC | PRN
  Administered 2020-04-06: 100 mL via INTRAVENOUS

## 2020-04-06 MED ORDER — SENNOSIDES-DOCUSATE SODIUM 8.6-50 MG PO TABS
1.0000 | ORAL_TABLET | Freq: Every evening | ORAL | Status: DC | PRN
  Filled 2020-04-06: qty 1

## 2020-04-06 MED ORDER — ACETAMINOPHEN 325 MG PO TABS
650.0000 mg | ORAL_TABLET | ORAL | Status: DC | PRN
  Administered 2020-04-06: 650 mg via ORAL
  Filled 2020-04-06: qty 2

## 2020-04-06 MED ORDER — ACETAMINOPHEN 160 MG/5ML PO SOLN: 650.0000 mg | ORAL | Status: DC | PRN

## 2020-04-06 MED ORDER — LEVETIRACETAM IN NACL 500 MG/100ML IV SOLN
500.0000 mg | Freq: Two times a day (BID) | INTRAVENOUS | Status: DC
  Administered 2020-04-06: 500 mg via INTRAVENOUS
  Filled 2020-04-06: qty 100

## 2020-04-06 MED ORDER — ENOXAPARIN SODIUM 40 MG/0.4ML ~~LOC~~ SOLN
40.0000 mg | SUBCUTANEOUS | Status: DC
  Administered 2020-04-06: 40 mg via SUBCUTANEOUS
  Filled 2020-04-06: qty 0.4

## 2020-04-06 MED ORDER — ACETAMINOPHEN 650 MG RE SUPP: 650.0000 mg | RECTAL | Status: DC | PRN

## 2020-04-06 NOTE — H&P (Signed)
History and Physical    David Watson VQM:[id number] DOB: Mar 03, 1961 DOA: 04/06/2020  PCP: Patient, No Pcp Per   Patient coming from: home  I have personally briefly reviewed patient's old medical records in [place]  Chief Complaint: seizure, right sided weakness.  HPI: David Watson is a 59 y.o. male with medical history significant of seizure, tobacco abuse, alcohol use and HTN; who presented to ED after experiencing seizure event and right sided weakness. Last seen normal prior to going to bed, reported events happened at 7:45 am. patient expressed not taking any meds currently and that his last seizure was in [place] approx 3 years ago. Patient denies traumas, bleeding, dysuria, cough, CP, SOB, nausea or vomiting. He reported some blurred vision.   ED Course: in ED CT neg for acute abnormalities, tele-neurology consulted and recommended TIA/stroke work up, secondary prevention with ASA, EEG evaluation and and empirically started on keppra.   Review of Systems: As per HPI otherwise all other systems reviewed and are negative.   Past Medical History:  Diagnosis Date  . Hypertension    put on ACE 6-8wks ago, ran out    Past Surgical History:  Procedure Laterality Date  . NECK SURGERY      Social History  reports that he has been smoking. He has been smoking about 0.50 packs per day. He has never used smokeless tobacco. He reports current alcohol use. He reports that he does not use drugs.  Allergies  Allergen Reactions  . Lisinopril Itching    Family History  Problem Relation Age of Onset  . Coronary artery disease Father 32       MI    Prior to Admission medications   Medication Sig Start Date End Date Taking? Authorizing Provider  acetaminophen (TYLENOL) 325 MG tablet Take 325 mg by mouth every 6 (six) hours as needed for headache.   Yes [provider]  albuterol (VENTOLIN HFA) 108 (90 Base) MCG/ACT inhaler Inhale 1-2 puffs into the  lungs every 6 (six) hours as needed for wheezing or shortness of breath.   Yes [provider]  HYDROcodone-acetaminophen (NORCO) 5-325 MG tablet Take 1 tablet by mouth 3 (three) times daily as needed for moderate pain. Patient not taking: Reported on 04/06/2020 11/04/18   Aundra Dubin, PA-C  ibuprofen (ADVIL,MOTRIN) 600 MG tablet Take 1 tablet (600 mg total) by mouth 4 (four) times daily. Patient not taking: Reported on 04/06/2020 10/24/18   Lily Kocher, PA-C    Physical Exam: Vitals:   04/06/20 0902 04/06/20 0903 04/06/20 0929 04/06/20 0959  BP:   (!) 150/108   Pulse: 95 94 78 80  Resp: 18 12 17 14   Temp:      TempSrc:      SpO2: 98% 98% 99% 98%  Weight:      Height:        Constitutional: NAD, currently oriented and following commands, no slurred speech. Reporting facial numbness and right sided weakness.  Vitals:   04/06/20 0902 04/06/20 0903 04/06/20 0929 04/06/20 0959  BP:   (!) 150/108   Pulse: 95 94 78 80  Resp: 18 12 17 14   Temp:      TempSrc:      SpO2: 98% 98% 99% 98%  Weight:      Height:       Eyes: PERRL, lids and conjunctivae normal, no icterus. ENMT: Mucous membranes are moist. Posterior pharynx clear of any exudate or lesions. Neck: normal, supple, no  masses, no thyromegaly, no JVD. Respiratory: clear to auscultation bilaterally, no wheezing, no crackles. Normal respiratory effort. No accessory muscle use.  Cardiovascular: Regular rate and rhythm, no murmurs / rubs / gallops. No extremity edema. 2+ pedal pulses. No carotid bruits.  Abdomen: no tenderness, no masses palpated. No hepatosplenomegaly. Bowel sounds positive.  Musculoskeletal: no clubbing / cyanosis. No joint deformity upper and lower extremities. No contractures. Normal muscle tone.  Skin: no rashes, no petechiae. Neurologic: CN 2-12 grossly intact. Right upper and lower extremity 4/5 in MS, rest of neurologic exam unremarkable. Psychiatric:  Alert and oriented x 3. Normal mood.    Labs on Admission: I have personally reviewed following labs and imaging studies  CBC: Recent Labs  Lab 04/06/20 0835 04/06/20 0843  WBC 5.7  --   NEUTROABS 3.8  --   HGB 15.5 16.0  HCT 46.5 47.0  MCV 104.5*  --   PLT 300  --     Basic Metabolic Panel: Recent Labs  Lab 04/06/20 0835 04/06/20 0843  NA 138 140  K 3.6 3.7  CL 104 105  CO2 23  --   GLUCOSE 123* 122*  BUN 12 11  CREATININE 0.97 1.00  CALCIUM 8.8*  --     GFR: Estimated Creatinine Clearance: 80.6 mL/min (by C-G formula based on SCr of 1 mg/dL).  Liver Function Tests: Recent Labs  Lab 04/06/20 0835  AST 21  ALT 18  ALKPHOS 69  BILITOT 0.2*  PROT 7.2  ALBUMIN 3.8    Urine analysis:    Component Value Date/Time   COLORURINE YELLOW 03/18/2014 0550   APPEARANCEUR CLEAR 03/18/2014 0550   LABSPEC 1.009 03/18/2014 0550   PHURINE 5.5 03/18/2014 0550   GLUCOSEU NEGATIVE 03/18/2014 0550   HGBUR NEGATIVE 03/18/2014 0550   BILIRUBINUR NEGATIVE 03/18/2014 0550   KETONESUR NEGATIVE 03/18/2014 0550   PROTEINUR NEGATIVE 03/18/2014 0550   UROBILINOGEN 0.2 03/18/2014 0550   NITRITE NEGATIVE 03/18/2014 0550   LEUKOCYTESUR NEGATIVE 03/18/2014 0550    Radiological Exams on Admission: CT Angio Head W or Wo Contrast  Result Date: 04/06/2020 CLINICAL DATA:  Neuro deficit, acute, stroke suspected. Additional history provided: Seizure and right-sided weakness. EXAM: CT ANGIOGRAPHY HEAD AND NECK CT PERFUSION BRAIN TECHNIQUE: Multidetector CT imaging of the head and neck was performed using the standard protocol during bolus administration of intravenous contrast. Multiplanar CT image reconstructions and MIPs were obtained to evaluate the vascular anatomy. Carotid stenosis measurements (when applicable) are obtained utilizing NASCET criteria, using the distal internal carotid diameter as the denominator. Multiphase CT imaging of the brain was performed following IV bolus contrast injection. Subsequent parametric  perfusion maps were calculated using RAPID software. CONTRAST:  OMNIPAQUE IOHEXOL 350 MG/ML SOLN COMPARISON:  Noncontrast head CT performed earlier the same day. FINDINGS: CTA NECK FINDINGS Aortic arch: Standard aortic branching. Atherosclerotic plaque within the visualized aortic arch. No hemodynamically significant innominate or proximal subclavian artery stenosis. Right carotid system: CCA and ICA patent within the neck without stenosis. Left carotid system: CCA and ICA patent within the neck without significant stenosis (50% or greater). Mild mixed plaque within the carotid bifurcation and proximal ICA. Vertebral arteries: The left vertebral artery is dominant. The vertebral arteries are patent within the neck without significant stenosis. Skeleton: No acute bony abnormality or aggressive osseous lesion. Sequela of prior C6-C7 anterior and posterior fusion. Chronic fracture deformity of the T1 and T2 spinous processes. Other neck: No neck mass or cervical lymphadenopathy. Upper chest: No consolidation within the  imaged lung apices. Review of the MIP images confirms the above findings CTA HEAD FINDINGS Anterior circulation: The intracranial internal carotid arteries are patent without significant stenosis. The M1 middle cerebral arteries are patent without significant stenosis. There is significant irregularity of a proximal to mid M2 superior division left MCA branch vessel with diminished and irregular enhancement seen more distally within this vessel. Findings are suspicious for a proximal to mid M2 occlusion or high-grade stenosis (series 510, image 31). No right M2 proximal branch occlusion or high-grade stenosis is identified. The right anterior cerebral artery is patent without significant stenosis. The left anterior cerebral artery is diffusely diminutive and this may be developmental. This vessel is poorly delineated beyond the A2/A3 junction and a high-grade stenosis within this vessel cannot be  excluded. Posterior circulation: The intracranial vertebral arteries are patent without significant stenosis, as is the basilar artery. The posterior cerebral arteries are patent proximally without significant stenosis. Posterior communicating arteries are poorly delineated and may be hypoplastic or absent bilaterally. Venous sinuses: Within limitations of contrast timing, no convincing thrombus. Anatomic variants: As described MIP images confirms the above findings CT Brain Perfusion Findings: ASPECTS: CBF (<30%) Volume: 0mL Perfusion (Tmax>6.0s) volume: 5mL (left temporal lobe) Mismatch Volume: 5mL Infarction Location:None identified These results were called by telephone at the time of interpretation on 04/06/2020 at 9:43 am to provider Benjiman Core , who verbally acknowledged these results. IMPRESSION: CTA neck: The common carotid, internal carotid and vertebral arteries are patent within the neck without hemodynamically significant stenosis. Mild atherosclerotic disease within the aortic arch and left carotid system as described. CTA head: 1. Significant irregularity of a proximal to mid M2 superior division left MCA branch vessel with diminished and irregular enhancement seen more distally within this vessel. Findings are suspicious for possible proximal to mid left M2 branch occlusion or high-grade stenosis. 2. The left anterior cerebral artery is diffusely diminutive and this may be developmental. This vessel is poorly delineated beyond the A2/A3 junction and a high-grade stenosis within this vessel cannot be excluded. CT perfusion head: The perfusion software identifies a 5 mL region of hypoperfused parenchyma utilizing the Tmax>6 seconds threshold within the left temporal lobe. The perfusion software identifies no core infarct. Reported mismatch volume: 5 mL. Electronically Signed   By: Jackey Loge DO   On: 04/06/2020 09:44   CT Angio Neck W and/or Wo Contrast  Result Date: 04/06/2020 CLINICAL  DATA:  Neuro deficit, acute, stroke suspected. Additional history provided: Seizure and right-sided weakness. EXAM: CT ANGIOGRAPHY HEAD AND NECK CT PERFUSION BRAIN TECHNIQUE: Multidetector CT imaging of the head and neck was performed using the standard protocol during bolus administration of intravenous contrast. Multiplanar CT image reconstructions and MIPs were obtained to evaluate the vascular anatomy. Carotid stenosis measurements (when applicable) are obtained utilizing NASCET criteria, using the distal internal carotid diameter as the denominator. Multiphase CT imaging of the brain was performed following IV bolus contrast injection. Subsequent parametric perfusion maps were calculated using RAPID software. CONTRAST:  OMNIPAQUE IOHEXOL 350 MG/ML SOLN COMPARISON:  Noncontrast head CT performed earlier the same day. FINDINGS: CTA NECK FINDINGS Aortic arch: Standard aortic branching. Atherosclerotic plaque within the visualized aortic arch. No hemodynamically significant innominate or proximal subclavian artery stenosis. Right carotid system: CCA and ICA patent within the neck without stenosis. Left carotid system: CCA and ICA patent within the neck without significant stenosis (50% or greater). Mild mixed plaque within the carotid bifurcation and proximal ICA. Vertebral arteries: The left vertebral  artery is dominant. The vertebral arteries are patent within the neck without significant stenosis. Skeleton: No acute bony abnormality or aggressive osseous lesion. Sequela of prior C6-C7 anterior and posterior fusion. Chronic fracture deformity of the T1 and T2 spinous processes. Other neck: No neck mass or cervical lymphadenopathy. Upper chest: No consolidation within the imaged lung apices. Review of the MIP images confirms the above findings CTA HEAD FINDINGS Anterior circulation: The intracranial internal carotid arteries are patent without significant stenosis. The M1 middle cerebral arteries are patent  without significant stenosis. There is significant irregularity of a proximal to mid M2 superior division left MCA branch vessel with diminished and irregular enhancement seen more distally within this vessel. Findings are suspicious for a proximal to mid M2 occlusion or high-grade stenosis (series 510, image 31). No right M2 proximal branch occlusion or high-grade stenosis is identified. The right anterior cerebral artery is patent without significant stenosis. The left anterior cerebral artery is diffusely diminutive and this may be developmental. This vessel is poorly delineated beyond the A2/A3 junction and a high-grade stenosis within this vessel cannot be excluded. Posterior circulation: The intracranial vertebral arteries are patent without significant stenosis, as is the basilar artery. The posterior cerebral arteries are patent proximally without significant stenosis. Posterior communicating arteries are poorly delineated and may be hypoplastic or absent bilaterally. Venous sinuses: Within limitations of contrast timing, no convincing thrombus. Anatomic variants: As described MIP images confirms the above findings CT Brain Perfusion Findings: ASPECTS: CBF (<30%) Volume: 0mL Perfusion (Tmax>6.0s) volume: 5mL (left temporal lobe) Mismatch Volume: 5mL Infarction Location:None identified These results were called by telephone at the time of interpretation on 04/06/2020 at 9:43 am to provider Benjiman CoreNATHAN PICKERING , who verbally acknowledged these results. IMPRESSION: CTA neck: The common carotid, internal carotid and vertebral arteries are patent within the neck without hemodynamically significant stenosis. Mild atherosclerotic disease within the aortic arch and left carotid system as described. CTA head: 1. Significant irregularity of a proximal to mid M2 superior division left MCA branch vessel with diminished and irregular enhancement seen more distally within this vessel. Findings are suspicious for possible  proximal to mid left M2 branch occlusion or high-grade stenosis. 2. The left anterior cerebral artery is diffusely diminutive and this may be developmental. This vessel is poorly delineated beyond the A2/A3 junction and a high-grade stenosis within this vessel cannot be excluded. CT perfusion head: The perfusion software identifies a 5 mL region of hypoperfused parenchyma utilizing the Tmax>6 seconds threshold within the left temporal lobe. The perfusion software identifies no core infarct. Reported mismatch volume: 5 mL. Electronically Signed   By: Jackey LogeKyle  Golden DO   On: 04/06/2020 09:44   CT CEREBRAL PERFUSION W CONTRAST  Result Date: 04/06/2020 CLINICAL DATA:  Neuro deficit, acute, stroke suspected. Additional history provided: Seizure and right-sided weakness. EXAM: CT ANGIOGRAPHY HEAD AND NECK CT PERFUSION BRAIN TECHNIQUE: Multidetector CT imaging of the head and neck was performed using the standard protocol during bolus administration of intravenous contrast. Multiplanar CT image reconstructions and MIPs were obtained to evaluate the vascular anatomy. Carotid stenosis measurements (when applicable) are obtained utilizing NASCET criteria, using the distal internal carotid diameter as the denominator. Multiphase CT imaging of the brain was performed following IV bolus contrast injection. Subsequent parametric perfusion maps were calculated using RAPID software. CONTRAST:  100mL OMNIPAQUE IOHEXOL 350 MG/ML SOLN COMPARISON:  Noncontrast head CT performed earlier the same day. FINDINGS: CTA NECK FINDINGS Aortic arch: Standard aortic branching. Atherosclerotic plaque within the visualized aortic  arch. No hemodynamically significant innominate or proximal subclavian artery stenosis. Right carotid system: CCA and ICA patent within the neck without stenosis. Left carotid system: CCA and ICA patent within the neck without significant stenosis (50% or greater). Mild mixed plaque within the carotid bifurcation and  proximal ICA. Vertebral arteries: The left vertebral artery is dominant. The vertebral arteries are patent within the neck without significant stenosis. Skeleton: No acute bony abnormality or aggressive osseous lesion. Sequela of prior C6-C7 anterior and posterior fusion. Chronic fracture deformity of the T1 and T2 spinous processes. Other neck: No neck mass or cervical lymphadenopathy. Upper chest: No consolidation within the imaged lung apices. Review of the MIP images confirms the above findings CTA HEAD FINDINGS Anterior circulation: The intracranial internal carotid arteries are patent without significant stenosis. The M1 middle cerebral arteries are patent without significant stenosis. There is significant irregularity of a proximal to mid M2 superior division left MCA branch vessel with diminished and irregular enhancement seen more distally within this vessel. Findings are suspicious for a proximal to mid M2 occlusion or high-grade stenosis (series 510, image 31). No right M2 proximal branch occlusion or high-grade stenosis is identified. The right anterior cerebral artery is patent without significant stenosis. The left anterior cerebral artery is diffusely diminutive and this may be developmental. This vessel is poorly delineated beyond the A2/A3 junction and a high-grade stenosis within this vessel cannot be excluded. Posterior circulation: The intracranial vertebral arteries are patent without significant stenosis, as is the basilar artery. The posterior cerebral arteries are patent proximally without significant stenosis. Posterior communicating arteries are poorly delineated and may be hypoplastic or absent bilaterally. Venous sinuses: Within limitations of contrast timing, no convincing thrombus. Anatomic variants: As described MIP images confirms the above findings CT Brain Perfusion Findings: ASPECTS: CBF (<30%) Volume: 2mL Perfusion (Tmax>6.0s) volume: 17mL (left temporal lobe) Mismatch Volume: 32mL  Infarction Location:None identified These results were called by telephone at the time of interpretation on 04/06/2020 at 9:43 am to provider Benjiman Core , who verbally acknowledged these results. IMPRESSION: CTA neck: The common carotid, internal carotid and vertebral arteries are patent within the neck without hemodynamically significant stenosis. Mild atherosclerotic disease within the aortic arch and left carotid system as described. CTA head: 1. Significant irregularity of a proximal to mid M2 superior division left MCA branch vessel with diminished and irregular enhancement seen more distally within this vessel. Findings are suspicious for possible proximal to mid left M2 branch occlusion or high-grade stenosis. 2. The left anterior cerebral artery is diffusely diminutive and this may be developmental. This vessel is poorly delineated beyond the A2/A3 junction and a high-grade stenosis within this vessel cannot be excluded. CT perfusion head: The perfusion software identifies a 5 mL region of hypoperfused parenchyma utilizing the Tmax>6 seconds threshold within the left temporal lobe. The perfusion software identifies no core infarct. Reported mismatch volume: 5 mL. Electronically Signed   By: Jackey Loge DO   On: 04/06/2020 09:44   CT HEAD CODE STROKE WO CONTRAST  Result Date: 04/06/2020 CLINICAL DATA:  Code stroke. Neuro deficit, acute, stroke suspected. Additional history provided: Acute right-sided weakness at 7:30 with seizure. EXAM: CT HEAD WITHOUT CONTRAST TECHNIQUE: Contiguous axial images were obtained from the base of the skull through the vertex without intravenous contrast. COMPARISON:  No pertinent prior studies available for comparison. FINDINGS: Brain: Cerebral volume is normal for age. There is no acute intracranial hemorrhage. No demarcated cortical infarct is identified. No extra-axial fluid collection. No evidence  of intracranial mass. No midline shift. Vascular: No hyperdense  vessel. Skull: Normal. Negative for fracture or focal lesion. Sinuses/Orbits: Visualized orbits show no acute finding. No significant paranasal sinus disease or mastoid effusion at the imaged levels. ASPECTS ([place] Stroke Program Early CT Score) - Ganglionic level infarction (caudate, lentiform nuclei, internal capsule, insula, M1-M3 cortex): 7 - Supraganglionic infarction (M4-M6 cortex): 3 Total score (0-10 with 10 being normal): 10 These results were called by telephone at the time of interpretation on 04/06/2020 at 8:44 am to provider Benjiman Core , who verbally acknowledged these results. IMPRESSION: No evidence of acute intracranial hemorrhage or acute demarcated cortical infarction. ASPECTS is 10 Electronically Signed   By: Jackey Loge DO   On: 04/06/2020 08:44    EKG: Independently reviewed. No acute abnormalities appreciated. Normal sinus rhythm.   Assessment/Plan 1-Stroke-like symptoms -with concerns for TIA vs Todd's-paralysis -observation admission for TIA work up -ASA for secondary prevention  -checking TSH, B12, lipid panel and 2-D echo -will check MRI and EEG -PT/OT and speech therapy. -once swallowing eval completed with pursuit heart healthy diet   2-Seizure -prior hx of the same reported -no prior use of antiepileptics -neurology consulted -will use keppra -EEG ordered  3-HTN -no meds prior to admission -allow for permissive HTN -follow VS -heart healthy diet  4-tobacco abuse -cessation counseling provided -started on nicotine patch   5-macrocytosis -will check B12   DVT prophylaxis: Lovenox. Code Status:   Full Code.  Family Communication:  No family at bedside Disposition Plan:   Patient is from:  Home   Anticipated DC to:  To be determine, most likely home  Anticipated DC date:  04/07/20  Anticipated DC barriers: Stroke work up completion; evaluation by PT/OT for safety at discharge. Consults called:  Neurology (Dr. Gerilyn Pilgrim) Admission status:    Observation, telemetry, LOS < 2 midnights.   Severity of Illness: Observation, mild to moderate severity. Presenting with stroke like symptoms and episode of seizure. Needs evaluation by neurology service and stroke rule out work up.   Vassie Loll MD [facility]  How to contact the [facility] Attending or Consulting provider 7A - 7P or covering provider during after hours 7P -7A, for this patient?   1. Check the care team in [facility] and look for a) attending/consulting [facility] provider listed and b) the [facility] team listed 2. Log into [email or web address] and use [facility] universal password to access. If you do not have the password, please contact the hospital operator. 3. Locate the [facility] provider you are looking for under [facility] and page to a number that you can be directly reached. 4. If you still have difficulty reaching the provider, please page the [facility] (Director on Call) for the Hospitalists listed on amion for assistance.  04/06/2020, 10:53 AM

## 2020-04-06 NOTE — ED Notes (Signed)
PT is aware we need urine sample, urinal at bedside.  

## 2020-04-06 NOTE — Plan of Care (Signed)
  Problem: Education: Goal: Knowledge of disease or condition will improve Outcome: Progressing Goal: Knowledge of secondary prevention will improve Outcome: Progressing Goal: Knowledge of patient specific risk factors addressed and post discharge goals established will improve Outcome: Progressing   Problem: Self-Care: Goal: Ability to participate in self-care as condition permits will improve Outcome: Progressing   

## 2020-04-06 NOTE — ED Provider Notes (Signed)
[facility] EMERGENCY DEPARTMENT Provider Note   CSN: [id number] Arrival date & time: 04/06/20  0973  An emergency department physician performed an initial assessment on this suspected stroke patient at 0830.  History Chief Complaint  Patient presents with  . Code Stroke    David Watson is a 59 y.o. male.  HPI Level 5 caveat due to altered mental status. Patient presented with seizure and weakness.  Reportedly at 745 had a seizure.  Reportedly has seizures years ago but really cannot tell me quite much about it.  Said last one was 3 years ago in [place].  Reportedly after the seizure was normal but then developed some right-sided weakness some difficulty speaking.  Brought in here.  Met prior to being in a room and screened and code stroke was called.  Does have right arm right leg weakness.  Also may be right facial droop.  Patient states his eyes are little blurred.    Past Medical History:  Diagnosis Date  . Hypertension    put on ACE 6-8wks ago, ran out    Patient Active Problem List   Diagnosis Date Noted  . Chest pain with moderate risk for cardiac etiology 10/28/2012  . Smoking 10/28/2012  . Family history of coronary artery disease 10/28/2012  . DJD (degenerative joint disease), C- spine surgury X 2 10/28/2012  . CARPAL TUNNEL SYNDROME 01/25/2009  . ARM NUMBNESS 01/06/2009    Past Surgical History:  Procedure Laterality Date  . NECK SURGERY         Family History  Problem Relation Age of Onset  . Coronary artery disease Father 38       MI    Social History   Tobacco Use  . Smoking status: Current Every Day Smoker    Packs/day: 0.50  . Smokeless tobacco: Never Used  Substance Use Topics  . Alcohol use: Yes    Comment: occ  last etoh last night.  . Drug use: No    Home Medications Prior to Admission medications   Medication Sig Start Date End Date Taking? Authorizing Provider  acetaminophen (TYLENOL) 325 MG tablet Take 325 mg by mouth  every 6 (six) hours as needed for headache.   Yes [provider]  albuterol (VENTOLIN HFA) 108 (90 Base) MCG/ACT inhaler Inhale 1-2 puffs into the lungs every 6 (six) hours as needed for wheezing or shortness of breath.   Yes [provider]  HYDROcodone-acetaminophen (NORCO) 5-325 MG tablet Take 1 tablet by mouth 3 (three) times daily as needed for moderate pain. Patient not taking: Reported on 04/06/2020 11/04/18   Aundra Dubin, PA-C  ibuprofen (ADVIL,MOTRIN) 600 MG tablet Take 1 tablet (600 mg total) by mouth 4 (four) times daily. Patient not taking: Reported on 04/06/2020 10/24/18   Lily Kocher, PA-C    Allergies    Lisinopril  Review of Systems   Review of Systems  Unable to perform ROS: Mental status change    Physical Exam Updated Vital Signs BP (!) 150/108   Pulse 80   Temp 98.1 F (36.7 C) (Oral)   Resp 14   Ht _0  (1.676 m)   Wt 77.1 kg   SpO2 98%   BMI 27.44 kg/m   Physical Exam Vitals reviewed.  HENT:     Head: Normocephalic.  Eyes:     Extraocular Movements: Extraocular movements intact.     Pupils: Pupils are equal, round, and reactive to light.  Cardiovascular:     Rate and  Rhythm: Regular rhythm.  Abdominal:     Tenderness: There is no abdominal tenderness.  Genitourinary:    Comments: Patient has been incontinent of urine. Musculoskeletal:     Cervical back: Neck supple.  Skin:    General: Skin is warm.     Capillary Refill: Capillary refill takes less than 2 seconds.  Neurological:     Mental Status: He is alert and oriented to person, place, and time.     Comments: Awake and pleasant, but some confusion.  May have some mild right-sided facial droop.  Right upper and lower extremity mildly decreased strength compared to contralateral side.  Eye movements intact.     ED Results / Procedures / Treatments   Labs (all labs ordered are listed, but only abnormal results are displayed) Labs Reviewed  ETHANOL - Abnormal;  Notable for the following components:      Result Value   Alcohol, Ethyl (B) 33 (*)    All other components within normal limits  CBC - Abnormal; Notable for the following components:   MCV 104.5 (*)    MCH 34.8 (*)    All other components within normal limits  COMPREHENSIVE METABOLIC PANEL - Abnormal; Notable for the following components:   Glucose, Bld 123 (*)    Calcium 8.8 (*)    Total Bilirubin 0.2 (*)    All other components within normal limits  I-STAT CHEM 8, ED - Abnormal; Notable for the following components:   Glucose, Bld 122 (*)    Calcium, Ion 1.10 (*)    TCO2 21 (*)    All other components within normal limits  CBG MONITORING, ED - Abnormal; Notable for the following components:   Glucose-Capillary 123 (*)    All other components within normal limits  PROTIME-INR  APTT  DIFFERENTIAL  RAPID URINE DRUG SCREEN, HOSP PERFORMED  URINALYSIS, ROUTINE W REFLEX MICROSCOPIC    EKG EKG Interpretation  Date/Time:  Wednesday April 06 2020 09:00:42 EDT Ventricular Rate:  88 PR Interval:    QRS Duration: 106 QT Interval:  355 QTC Calculation: 430 R Axis:   -66 Text Interpretation: Sinus rhythm Borderline prolonged PR interval Prominent P waves, nondiagnostic Left anterior fascicular block RSR' in V1 or V2, right VCD or RVH Probable left ventricular hypertrophy Confirmed by Davonna Belling [phone]) on 04/06/2020 10:05:59 AM   Radiology CT Angio Head W or Wo Contrast  Result Date: 04/06/2020 CLINICAL DATA:  Neuro deficit, acute, stroke suspected. Additional history provided: Seizure and right-sided weakness. EXAM: CT ANGIOGRAPHY HEAD AND NECK CT PERFUSION BRAIN TECHNIQUE: Multidetector CT imaging of the head and neck was performed using the standard protocol during bolus administration of intravenous contrast. Multiplanar CT image reconstructions and MIPs were obtained to evaluate the vascular anatomy. Carotid stenosis measurements (when applicable) are obtained utilizing  NASCET criteria, using the distal internal carotid diameter as the denominator. Multiphase CT imaging of the brain was performed following IV bolus contrast injection. Subsequent parametric perfusion maps were calculated using RAPID software. CONTRAST:  1105m OMNIPAQUE IOHEXOL 350 MG/ML SOLN COMPARISON:  Noncontrast head CT performed earlier the same day. FINDINGS: CTA NECK FINDINGS Aortic arch: Standard aortic branching. Atherosclerotic plaque within the visualized aortic arch. No hemodynamically significant innominate or proximal subclavian artery stenosis. Right carotid system: CCA and ICA patent within the neck without stenosis. Left carotid system: CCA and ICA patent within the neck without significant stenosis (50% or greater). Mild mixed plaque within the carotid bifurcation and proximal ICA. Vertebral arteries: The left vertebral  artery is dominant. The vertebral arteries are patent within the neck without significant stenosis. Skeleton: No acute bony abnormality or aggressive osseous lesion. Sequela of prior C6-C7 anterior and posterior fusion. Chronic fracture deformity of the T1 and T2 spinous processes. Other neck: No neck mass or cervical lymphadenopathy. Upper chest: No consolidation within the imaged lung apices. Review of the MIP images confirms the above findings CTA HEAD FINDINGS Anterior circulation: The intracranial internal carotid arteries are patent without significant stenosis. The M1 middle cerebral arteries are patent without significant stenosis. There is significant irregularity of a proximal to mid M2 superior division left MCA branch vessel with diminished and irregular enhancement seen more distally within this vessel. Findings are suspicious for a proximal to mid M2 occlusion or high-grade stenosis (series 510, image 31). No right M2 proximal branch occlusion or high-grade stenosis is identified. The right anterior cerebral artery is patent without significant stenosis. The left  anterior cerebral artery is diffusely diminutive and this may be developmental. This vessel is poorly delineated beyond the A2/A3 junction and a high-grade stenosis within this vessel cannot be excluded. Posterior circulation: The intracranial vertebral arteries are patent without significant stenosis, as is the basilar artery. The posterior cerebral arteries are patent proximally without significant stenosis. Posterior communicating arteries are poorly delineated and may be hypoplastic or absent bilaterally. Venous sinuses: Within limitations of contrast timing, no convincing thrombus. Anatomic variants: As described MIP images confirms the above findings CT Brain Perfusion Findings: ASPECTS: CBF (<30%) Volume: 43m Perfusion (Tmax>6.0s) volume: 528m(left temporal lobe) Mismatch Volume: 67m53mnfarction Location:None identified These results were called by telephone at the time of interpretation on 04/06/2020 at 9:43 am to provider NATDavonna Bellingwho verbally acknowledged these results. IMPRESSION: CTA neck: The common carotid, internal carotid and vertebral arteries are patent within the neck without hemodynamically significant stenosis. Mild atherosclerotic disease within the aortic arch and left carotid system as described. CTA head: 1. Significant irregularity of a proximal to mid M2 superior division left MCA branch vessel with diminished and irregular enhancement seen more distally within this vessel. Findings are suspicious for possible proximal to mid left M2 branch occlusion or high-grade stenosis. 2. The left anterior cerebral artery is diffusely diminutive and this may be developmental. This vessel is poorly delineated beyond the A2/A3 junction and a high-grade stenosis within this vessel cannot be excluded. CT perfusion head: The perfusion software identifies a 5 mL region of hypoperfused parenchyma utilizing the Tmax>6 seconds threshold within the left temporal lobe. The perfusion software identifies  no core infarct. Reported mismatch volume: 5 mL. Electronically Signed   By: KylKellie Simmering   On: 04/06/2020 09:44   CT Angio Neck W and/or Wo Contrast  Result Date: 04/06/2020 CLINICAL DATA:  Neuro deficit, acute, stroke suspected. Additional history provided: Seizure and right-sided weakness. EXAM: CT ANGIOGRAPHY HEAD AND NECK CT PERFUSION BRAIN TECHNIQUE: Multidetector CT imaging of the head and neck was performed using the standard protocol during bolus administration of intravenous contrast. Multiplanar CT image reconstructions and MIPs were obtained to evaluate the vascular anatomy. Carotid stenosis measurements (when applicable) are obtained utilizing NASCET criteria, using the distal internal carotid diameter as the denominator. Multiphase CT imaging of the brain was performed following IV bolus contrast injection. Subsequent parametric perfusion maps were calculated using RAPID software. CONTRAST:  100m68mNIPAQUE IOHEXOL 350 MG/ML SOLN COMPARISON:  Noncontrast head CT performed earlier the same day. FINDINGS: CTA NECK FINDINGS Aortic arch: Standard aortic branching. Atherosclerotic plaque within the  visualized aortic arch. No hemodynamically significant innominate or proximal subclavian artery stenosis. Right carotid system: CCA and ICA patent within the neck without stenosis. Left carotid system: CCA and ICA patent within the neck without significant stenosis (50% or greater). Mild mixed plaque within the carotid bifurcation and proximal ICA. Vertebral arteries: The left vertebral artery is dominant. The vertebral arteries are patent within the neck without significant stenosis. Skeleton: No acute bony abnormality or aggressive osseous lesion. Sequela of prior C6-C7 anterior and posterior fusion. Chronic fracture deformity of the T1 and T2 spinous processes. Other neck: No neck mass or cervical lymphadenopathy. Upper chest: No consolidation within the imaged lung apices. Review of the MIP images  confirms the above findings CTA HEAD FINDINGS Anterior circulation: The intracranial internal carotid arteries are patent without significant stenosis. The M1 middle cerebral arteries are patent without significant stenosis. There is significant irregularity of a proximal to mid M2 superior division left MCA branch vessel with diminished and irregular enhancement seen more distally within this vessel. Findings are suspicious for a proximal to mid M2 occlusion or high-grade stenosis (series 510, image 31). No right M2 proximal branch occlusion or high-grade stenosis is identified. The right anterior cerebral artery is patent without significant stenosis. The left anterior cerebral artery is diffusely diminutive and this may be developmental. This vessel is poorly delineated beyond the A2/A3 junction and a high-grade stenosis within this vessel cannot be excluded. Posterior circulation: The intracranial vertebral arteries are patent without significant stenosis, as is the basilar artery. The posterior cerebral arteries are patent proximally without significant stenosis. Posterior communicating arteries are poorly delineated and may be hypoplastic or absent bilaterally. Venous sinuses: Within limitations of contrast timing, no convincing thrombus. Anatomic variants: As described MIP images confirms the above findings CT Brain Perfusion Findings: ASPECTS: CBF (<30%) Volume: 71m Perfusion (Tmax>6.0s) volume: 550m(left temporal lobe) Mismatch Volume: 65m17mnfarction Location:None identified These results were called by telephone at the time of interpretation on 04/06/2020 at 9:43 am to provider NATDavonna Bellingwho verbally acknowledged these results. IMPRESSION: CTA neck: The common carotid, internal carotid and vertebral arteries are patent within the neck without hemodynamically significant stenosis. Mild atherosclerotic disease within the aortic arch and left carotid system as described. CTA head: 1. Significant  irregularity of a proximal to mid M2 superior division left MCA branch vessel with diminished and irregular enhancement seen more distally within this vessel. Findings are suspicious for possible proximal to mid left M2 branch occlusion or high-grade stenosis. 2. The left anterior cerebral artery is diffusely diminutive and this may be developmental. This vessel is poorly delineated beyond the A2/A3 junction and a high-grade stenosis within this vessel cannot be excluded. CT perfusion head: The perfusion software identifies a 5 mL region of hypoperfused parenchyma utilizing the Tmax>6 seconds threshold within the left temporal lobe. The perfusion software identifies no core infarct. Reported mismatch volume: 5 mL. Electronically Signed   By: KylKellie Simmering   On: 04/06/2020 09:44   CT CEREBRAL PERFUSION W CONTRAST  Result Date: 04/06/2020 CLINICAL DATA:  Neuro deficit, acute, stroke suspected. Additional history provided: Seizure and right-sided weakness. EXAM: CT ANGIOGRAPHY HEAD AND NECK CT PERFUSION BRAIN TECHNIQUE: Multidetector CT imaging of the head and neck was performed using the standard protocol during bolus administration of intravenous contrast. Multiplanar CT image reconstructions and MIPs were obtained to evaluate the vascular anatomy. Carotid stenosis measurements (when applicable) are obtained utilizing NASCET criteria, using the distal internal carotid diameter as the denominator.  Multiphase CT imaging of the brain was performed following IV bolus contrast injection. Subsequent parametric perfusion maps were calculated using RAPID software. CONTRAST:  167m OMNIPAQUE IOHEXOL 350 MG/ML SOLN COMPARISON:  Noncontrast head CT performed earlier the same day. FINDINGS: CTA NECK FINDINGS Aortic arch: Standard aortic branching. Atherosclerotic plaque within the visualized aortic arch. No hemodynamically significant innominate or proximal subclavian artery stenosis. Right carotid system: CCA and ICA  patent within the neck without stenosis. Left carotid system: CCA and ICA patent within the neck without significant stenosis (50% or greater). Mild mixed plaque within the carotid bifurcation and proximal ICA. Vertebral arteries: The left vertebral artery is dominant. The vertebral arteries are patent within the neck without significant stenosis. Skeleton: No acute bony abnormality or aggressive osseous lesion. Sequela of prior C6-C7 anterior and posterior fusion. Chronic fracture deformity of the T1 and T2 spinous processes. Other neck: No neck mass or cervical lymphadenopathy. Upper chest: No consolidation within the imaged lung apices. Review of the MIP images confirms the above findings CTA HEAD FINDINGS Anterior circulation: The intracranial internal carotid arteries are patent without significant stenosis. The M1 middle cerebral arteries are patent without significant stenosis. There is significant irregularity of a proximal to mid M2 superior division left MCA branch vessel with diminished and irregular enhancement seen more distally within this vessel. Findings are suspicious for a proximal to mid M2 occlusion or high-grade stenosis (series 510, image 31). No right M2 proximal branch occlusion or high-grade stenosis is identified. The right anterior cerebral artery is patent without significant stenosis. The left anterior cerebral artery is diffusely diminutive and this may be developmental. This vessel is poorly delineated beyond the A2/A3 junction and a high-grade stenosis within this vessel cannot be excluded. Posterior circulation: The intracranial vertebral arteries are patent without significant stenosis, as is the basilar artery. The posterior cerebral arteries are patent proximally without significant stenosis. Posterior communicating arteries are poorly delineated and may be hypoplastic or absent bilaterally. Venous sinuses: Within limitations of contrast timing, no convincing thrombus. Anatomic  variants: As described MIP images confirms the above findings CT Brain Perfusion Findings: ASPECTS: CBF (<30%) Volume: 038mPerfusion (Tmax>6.0s) volume: 36m62mleft temporal lobe) Mismatch Volume: 36mL59mfarction Location:None identified These results were called by telephone at the time of interpretation on 04/06/2020 at 9:43 am to provider NATHDavonna Bellingho verbally acknowledged these results. IMPRESSION: CTA neck: The common carotid, internal carotid and vertebral arteries are patent within the neck without hemodynamically significant stenosis. Mild atherosclerotic disease within the aortic arch and left carotid system as described. CTA head: 1. Significant irregularity of a proximal to mid M2 superior division left MCA branch vessel with diminished and irregular enhancement seen more distally within this vessel. Findings are suspicious for possible proximal to mid left M2 branch occlusion or high-grade stenosis. 2. The left anterior cerebral artery is diffusely diminutive and this may be developmental. This vessel is poorly delineated beyond the A2/A3 junction and a high-grade stenosis within this vessel cannot be excluded. CT perfusion head: The perfusion software identifies a 5 mL region of hypoperfused parenchyma utilizing the Tmax>6 seconds threshold within the left temporal lobe. The perfusion software identifies no core infarct. Reported mismatch volume: 5 mL. Electronically Signed   By: KyleKellie Simmering  On: 04/06/2020 09:44   CT HEAD CODE STROKE WO CONTRAST  Result Date: 04/06/2020 CLINICAL DATA:  Code stroke. Neuro deficit, acute, stroke suspected. Additional history provided: Acute right-sided weakness at 7:30 with seizure. EXAM: CT HEAD  WITHOUT CONTRAST TECHNIQUE: Contiguous axial images were obtained from the base of the skull through the vertex without intravenous contrast. COMPARISON:  No pertinent prior studies available for comparison. FINDINGS: Brain: Cerebral volume is normal for age.  There is no acute intracranial hemorrhage. No demarcated cortical infarct is identified. No extra-axial fluid collection. No evidence of intracranial mass. No midline shift. Vascular: No hyperdense vessel. Skull: Normal. Negative for fracture or focal lesion. Sinuses/Orbits: Visualized orbits show no acute finding. No significant paranasal sinus disease or mastoid effusion at the imaged levels. ASPECTS ([place] Stroke Program Early CT Score) - Ganglionic level infarction (caudate, lentiform nuclei, internal capsule, insula, M1-M3 cortex): 7 - Supraganglionic infarction (M4-M6 cortex): 3 Total score (0-10 with 10 being normal): 10 These results were called by telephone at the time of interpretation on 04/06/2020 at 8:44 am to provider Davonna Belling , who verbally acknowledged these results. IMPRESSION: No evidence of acute intracranial hemorrhage or acute demarcated cortical infarction. ASPECTS is 10 Electronically Signed   By: Kellie Simmering DO   On: 04/06/2020 08:44    Procedures Procedures (including critical care time)  Medications Ordered in ED Medications  levETIRAcetam (KEPPRA) IVPB 1000 mg/100 mL premix (1,000 mg Intravenous New Bag/Given 04/06/20 0957)  iohexol (OMNIPAQUE) 350 MG/ML injection 75 mL (100 mLs Intravenous Contrast Given 04/06/20 0841)    ED Course  I have reviewed the triage vital signs and the nursing notes.  Pertinent labs & imaging results that were available during my care of the patient were reviewed by me and considered in my medical decision making (see chart for details).    MDM Rules/Calculators/A&P                          Patient with focal neuro deficits.  Did however begin after seizure.  History of seizure but cannot provide much history.  When patient later was more awake able to tell neurology he has had right-sided weakness with seizures in the past.  However CT reassuring but CTA showed possible M2 narrowing and CT perfusion showed possible small area of  decreased perfusion.  However deficits continue to improve.  Discussed with neurology and with improving deficits and mild deficits not a TPA candidate at this time even if MRI shows stroke.  However will need MRI EEG and admission to the hospital.  Will need risk factor reduction.  Will admit to unassigned medicine  CRITICAL CARE Performed by: Davonna Belling Total critical care time: 30 minutes Critical care time was exclusive of separately billable procedures and treating other patients. Critical care was necessary to treat or prevent imminent or life-threatening deterioration. Critical care was time spent personally by me on the following activities: development of treatment plan with patient and/or surrogate as well as nursing, discussions with consultants, evaluation of patient's response to treatment, examination of patient, obtaining history from patient or surrogate, ordering and performing treatments and interventions, ordering and review of laboratory studies, ordering and review of radiographic studies, pulse oximetry and re-evaluation of patient's condition.  Final Clinical Impression(s) / ED Diagnoses Final diagnoses:  Seizure ([place])  Weakness    Rx / DC Orders ED Discharge Orders    None       Davonna Belling, MD 04/06/20 1007

## 2020-04-06 NOTE — ED Triage Notes (Signed)
PT reports was on his way to work and had a seizure at 69.  Reports r sided numbness and blurred vision started at 0800.  Reports history of seizures but isn't on any seizure medication.  Reports last seizure was 3 or 4 years ago.  Pt was cleared by EDP in the hallway and code stroke was called.

## 2020-04-06 NOTE — Consult Note (Addendum)
I re-evaluated the patient due to 61ml left temporal mismatch and possible left M2 stenosis versus occlusion.   Currently the patient's weakness has resolved and he has an NIHSS of 1 for numbness.  This is signficiantly improved.  We discussed TPA - not a candidate as he has no disabling symtpoms and the risk is greater than benefit in this case.   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  Recommend ASA 81mg  daily, MRI brain and echocardiogram in addition to Keppra 500mg  bid.    [facility] Consult [facility]   Date of Service:   04/06/2020 08:49:01  Impression:     .  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 Would not treat with TPA as this seems to be a pattern for him. This makes stroke much less likely. Would start Keppra 500mg  bid. REcommend MRI brain and EEG.  Metrics: Last Known Well: 04/06/2020 07:45:00 [facility] Notification Time: 04/06/2020 08:49:01 Arrival Time: 04/06/2020 08:21:00 Stamp Time: 04/06/2020 08:49:01 Time First Login Attempt: 04/06/2020 08:58:02 Symptoms: seizure and right sided weakness NIHSS Start Assessment Time: 04/06/2020 09:05:01 Alteplase Early Mix Decision Time: 04/06/2020 09:13:37 Patient is not a candidate for Alteplase/Activase. Alteplase Medical Decision: 04/06/2020 09:13:29 Patient was not deemed candidate for Alteplase/Activase thrombolytics because of following reasons: Other Diagnosis suspected.  CT head showed no acute hemorrhage or acute core infarct.  ED Physician notified of diagnostic impression and management plan on 04/06/2020 09:14:41  Advanced Imaging: CTA Head and Neck Completed.  LVO:No   Our  recommendations are outlined below.  Recommendations:     .  Activate Stroke Protocol Admission/Order Set     .  Stroke/Telemetry Floor     .  Neuro Checks     .  Bedside Swallow Eval     .  DVT Prophylaxis     .  IV Fluids, Normal Saline     .  Head of Bed 30 Degrees     .  Euglycemia and Avoid Hyperthermia (PRN Acetaminophen)     .  Keppra 500mg  bid  Routine Consultation with Inhouse Neurology for Follow up Care  Sign Out:     .  Discussed with Emergency Department Provider    ------------------------------------------------------------------------------  History of Present Illness: Patient is a 59 year old Male.  Patient was brought by EMS for symptoms of seizure and right sided weakness  This is a 59 year old man with a hx of epilepsy. The aptient has had a bout 4 seizures in the past. there is no clear warning. He will have a generalized convulsion andthen is noted to have right sided weakness lasting about a day or so. The patient was in a car this mroning headed to work. He had a generalized tonic clonic seizure. He was confused afterwards. he was noted to have right sided weakness as well at the conclusion. He admits to ETOH use last night.  Last seen normal was within 4.5 hours. There is no history of hemorrhagic complications or intracranial hemorrhage. There is no history of Recent Anticoagulants. There is no history of recent major surgery. There is no history of recent stroke.  Past Medical History:     . Hypertension     . Stroke     .  There is NO history of Diabetes Mellitus     . There is NO history of Hyperlipidemia     . There is NO history of Atrial Fibrillation     . There is NO history of Coronary Artery Disease  Anticoagulant use:  No  Antiplatelet use: No     Examination: BP(181/108), Pulse(94), Blood Glucose(123) 1A: Level of Consciousness - Alert; keenly responsive + 0 1B: Ask Month and Age - Both Questions Right + 0 1C: Blink Eyes &  Squeeze Hands - Performs Both Tasks + 0 2: Test Horizontal Extraocular Movements - Normal + 0 3: Test Visual Fields - No Visual Loss + 0 4: Test Facial Palsy (Use Grimace if Obtunded) - Normal symmetry + 0 5A: Test Left Arm Motor Drift - No Drift for 10 Seconds + 0 5B: Test Right Arm Motor Drift - Drift, but doesn't hit bed + 1 6A: Test Left Leg Motor Drift - No Drift for 5 Seconds + 0 6B: Test Right Leg Motor Drift - No Drift for 5 Seconds + 0 7: Test Limb Ataxia (FNF/Heel-Shin) - No Ataxia + 0 8: Test Sensation - Mild-Moderate Loss: Less Sharp/More Dull + 1 9: Test Language/Aphasia - Normal; No aphasia + 0 10: Test Dysarthria - Normal + 0 11: Test Extinction/Inattention - No abnormality + 0  NIHSS Score: 2  Pre-Morbid Modified Ranking Scale: 0 Points = No symptoms at all   Patient/Family was informed the Neurology Consult would occur via TeleHealth consult by way of interactive audio and video telecommunications and consented to receiving care in this manner.   Patient is being evaluated for possible acute neurologic impairment and high probability of imminent or life-threatening deterioration. I spent total of 25 minutes providing care to this patient, including time for face to face visit via telemedicine, review of medical records, imaging studies and discussion of findings with providers, the patient and/or family.   Dr Barnetta Chapel   [facility] [phone]  Case [id number]

## 2020-04-06 NOTE — Progress Notes (Signed)
*  PRELIMINARY RESULTS* Echocardiogram 2D Echocardiogram has been performed.  Stacey Drain 04/06/2020, 12:54 PM

## 2020-04-07 ENCOUNTER — Observation Stay (HOSPITAL_COMMUNITY)
Admit: 2020-04-07 | Discharge: 2020-04-07 | Disposition: A | Discharge status: Home / Self Care | Payer: Medicare Other | Attending: Internal Medicine | Admitting: Internal Medicine

## 2020-04-07 DIAGNOSIS — E538 Deficiency of other specified B group vitamins: Secondary | ICD-10-CM | POA: Diagnosis not present

## 2020-04-07 DIAGNOSIS — G459 Transient cerebral ischemic attack, unspecified: Secondary | ICD-10-CM

## 2020-04-07 DIAGNOSIS — Z7289 Other problems related to lifestyle: Secondary | ICD-10-CM

## 2020-04-07 DIAGNOSIS — Z789 Other specified health status: Secondary | ICD-10-CM

## 2020-04-07 DIAGNOSIS — I10 Essential (primary) hypertension: Secondary | ICD-10-CM | POA: Diagnosis not present

## 2020-04-07 DIAGNOSIS — R569 Unspecified convulsions: Secondary | ICD-10-CM | POA: Diagnosis not present

## 2020-04-07 DIAGNOSIS — Z720 Tobacco use: Secondary | ICD-10-CM

## 2020-04-07 LAB — HEMOGLOBIN A1C
Hgb A1c MFr Bld: 5.2 % (ref 4.8–5.6)
Mean Plasma Glucose: 102.54 mg/dL

## 2020-04-07 LAB — LIPID PANEL
Cholesterol: 157 mg/dL (ref 0–200)
HDL: 66 mg/dL (ref >40)
LDL Cholesterol: 76 mg/dL (ref 0–99)
Total CHOL/HDL Ratio: 2.4 RATIO
Triglycerides: 74 mg/dL (ref <150)
VLDL: 15 mg/dL (ref 0–40)

## 2020-04-07 MED ORDER — NICOTINE 21 MG/24HR TD PT24: 21.0000 mg | MEDICATED_PATCH | Freq: Every day | TRANSDERMAL | 0 refills | Status: DC

## 2020-04-07 MED ORDER — CYANOCOBALAMIN 1000 MCG/ML IJ SOLN: INTRAMUSCULAR | 0 refills | Status: DC

## 2020-04-07 MED ORDER — ASPIRIN EC 81 MG PO TBEC: 81.0000 mg | DELAYED_RELEASE_TABLET | Freq: Every day | ORAL | 3 refills | Status: AC

## 2020-04-07 MED ORDER — ALBUTEROL SULFATE HFA 108 (90 BASE) MCG/ACT IN AERS: 1.0000 | INHALATION_SPRAY | Freq: Four times a day (QID) | RESPIRATORY_TRACT | 2 refills | Status: AC | PRN

## 2020-04-07 MED ORDER — LEVETIRACETAM 500 MG PO TABS: 500.0000 mg | ORAL_TABLET | Freq: Two times a day (BID) | ORAL | 1 refills | Status: DC

## 2020-04-07 MED ORDER — LOSARTAN POTASSIUM 25 MG PO TABS: 25.0000 mg | ORAL_TABLET | Freq: Every day | ORAL | 2 refills | Status: DC

## 2020-04-07 MED ORDER — LEVETIRACETAM 500 MG PO TABS
500.0000 mg | ORAL_TABLET | Freq: Two times a day (BID) | ORAL | Status: DC
  Administered 2020-04-07: 500 mg via ORAL
  Filled 2020-04-07: qty 1

## 2020-04-07 NOTE — Discharge Summary (Signed)
Physician Discharge Summary  KORIE BRABSON OZH:[id number] DOB: 07-Aug-1961 DOA: 04/06/2020  PCP: Patient, No Pcp Per  Admit date: 04/06/2020 Discharge date: 04/07/2020  Time spent: 35 minutes  Recommendations for Outpatient Follow-up:  1. Reassess blood pressure and adjust antihypertensive regimen as needed 2. Repeat basic metabolic panel to follow electrolytes and renal function 3. Repeat B12 level in 4-23-month with further decisions in near future regarding further repletion and maintenance supplementation.  4. Patient follow-up with neurology service to further determine the need of long-term antiepileptic drugs usage.   Discharge Diagnoses:  Active Problems:   Tobacco abuse   Stroke-like symptoms   Seizure (HCC)   TIA (transient ischemic attack)   Essential hypertension   Alcohol use   B12 deficiency   Discharge Condition: Stable and improved.  Discharged home with instruction to follow-up with neurology service and to establish care with a PCP.  CODE STATUS: Full code.  Diet recommendation: Heart healthy diet.  Filed Weights   04/06/20 0844 04/06/20 1830  Weight: 77.1 kg 81.7 kg    History of present illness:  59 y.o. male with medical history significant of seizure, tobacco abuse, alcohol use and HTN; who presented to ED after experiencing seizure event and right sided weakness. Last seen normal prior to going to bed, reported events happened at 7:45 am. patient expressed not taking any meds currently and that his last seizure was in [place] approx 3 years ago. Patient denies traumas, bleeding, dysuria, cough, CP, SOB, nausea or vomiting. He reported some blurred vision.   ED Course: in ED CT neg for acute abnormalities, tele-neurology consulted and recommended TIA/stroke work up, secondary prevention with ASA, EEG evaluation and and empirically started on keppra.    Hospital Course:  1-TIA -stroke work-up was negative for acute CVA. -Some decreased  caliber/moderate stenosis appreciated on CT angiogram of head and neck -A1c and lipid panel within normal limits -Patient advised to follow heart healthy diet -Started on aspirin for secondary prevention -Outpatient follow-up with neurology service. -Continue risk factor modifications: Tobacco cessation, no recreational drugs and blood pressure control. -No neurologic deficits at discharge.  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 -EEG was done and pending at discharge -Empirically treated with Keppra 500 mg twice a day, follow-up with neurology service in 4 weeks.  3-hypertension -Not taking any medications prior to admission -Discharged on losartan 25 mg on daily basis. -Heart healthy diet has been encouraged  4-macrocytosis/B12 deficiency -Patient B12 level 112 -Discharge on intramuscular repletion with 1000 mcg daily for 1 week, followed by weekly x1 month and then monthly after that. -Repeat B12 level in 4-52-month -Patient advised to stop alcohol consumption.  5-polysubstance abuse: Marijuana, cocaine, tobacco and alcohol -Extensive cessation counseling has been provided -No withdrawal abnormalities appreciated -Patient was receptive and looking to quit -TOC to provide outpatient resources for assistance with quitting process. -discharge on nicotine patch.  Procedures: See below for x-ray reports 2D echo: Preserved ejection fraction, no wall motion abnormalities or significant valvular disorders.   EEG: 04/07/20, final results pending/  Consultations:  Neurology service (Dr. Gerilyn Pilgrim): Recommendations given to treat empirically with aspirin and Keppra 500 mg twice a day.  He will follow-up final results on patient's EEG and will see him in 4 weeks.  Discharge Exam: Vitals:   04/07/20 0800 04/07/20 1348  BP: (!) 149/96 (!) 148/97  Pulse: 78 65  Resp: 18 16  Temp: 98.3 F (36.8 C) 98.3 F  (  36.8 C)  SpO2: 99% 100%    General: No chest pain, no nausea, no vomiting, no dysarthria, no residual weakness or complaints of headaches currently.  Ready to go home.  No further seizure activity appreciated. Cardiovascular: S1 and S2, no rubs, no gallops, no murmurs. Respiratory: Good air movement bilaterally, no crackles, no wheezing. Abdomen: Soft, nontender, distended, positive bowel sounds Extremities: No cyanosis or clubbing.  Discharge Instructions   Discharge Instructions    Diet - low sodium heart healthy   Complete by: As directed    Discharge instructions   Complete by: As directed    Take medications as prescribed Follow heart healthy diet Avoid the use of alcohol and tobacco -Stop the use of recreational drugs Follow-up with neurology service in 4 weeks (Dr. Gerilyn Pilgrim). -Maintain adequate hydration.     Allergies as of 04/07/2020      Reactions   Lisinopril Itching      Medication List    STOP taking these medications   HYDROcodone-acetaminophen 5-325 MG tablet Commonly known as: Norco   ibuprofen 600 MG tablet Commonly known as: ADVIL     TAKE these medications   acetaminophen 325 MG tablet Commonly known as: TYLENOL Take 325 mg by mouth every 6 (six) hours as needed for headache.   albuterol 108 (90 Base) MCG/ACT inhaler Commonly known as: VENTOLIN HFA Inhale 1-2 puffs into the lungs every 6 (six) hours as needed for wheezing or shortness of breath.   aspirin EC 81 MG tablet Take 1 tablet (81 mg total) by mouth daily. Swallow whole.   cyanocobalamin 1000 MCG/ML injection Commonly known as: (VITAMIN B-12) Indurated 1 mL daily for 5 more days; then weekly for a month and then monthly after that.   levETIRAcetam 500 MG tablet Commonly known as: KEPPRA Take 1 tablet (500 mg total) by mouth 2 (two) times daily.   losartan 25 MG tablet Commonly known as: Cozaar Take 1 tablet (25 mg total) by mouth daily.   nicotine 21 mg/24hr patch Commonly  known as: NICODERM CQ - dosed in mg/24 hours Place 1 patch (21 mg total) onto the skin daily. Start taking on: April 08, 2020      Allergies  Allergen Reactions   Lisinopril Itching    Follow-up Information    Beryle Beams, MD. Schedule an appointment as soon as possible for a visit in 4 week(s).   Specialty: Neurology Contact information: [address] [place] [address] [phone]               The results of significant diagnostics from this hospitalization (including imaging, microbiology, ancillary and laboratory) are listed below for reference.    Significant Diagnostic Studies: CT Angio Head W or Wo Contrast  Result Date: 04/06/2020 CLINICAL DATA:  Neuro deficit, acute, stroke suspected. Additional history provided: Seizure and right-sided weakness. EXAM: CT ANGIOGRAPHY HEAD AND NECK CT PERFUSION BRAIN TECHNIQUE: Multidetector CT imaging of the head and neck was performed using the standard protocol during bolus administration of intravenous contrast. Multiplanar CT image reconstructions and MIPs were obtained to evaluate the vascular anatomy. Carotid stenosis measurements (when applicable) are obtained utilizing NASCET criteria, using the distal internal carotid diameter as the denominator. Multiphase CT imaging of the brain was performed following IV bolus contrast injection. Subsequent parametric perfusion maps were calculated using RAPID software. CONTRAST:  OMNIPAQUE IOHEXOL 350 MG/ML SOLN COMPARISON:  Noncontrast head CT performed earlier the same day. FINDINGS: CTA NECK FINDINGS Aortic arch: Standard aortic branching. Atherosclerotic  plaque within the visualized aortic arch. No hemodynamically significant innominate or proximal subclavian artery stenosis. Right carotid system: CCA and ICA patent within the neck without stenosis. Left carotid system: CCA and ICA patent within the neck without significant stenosis (50% or greater). Mild mixed plaque within  the carotid bifurcation and proximal ICA. Vertebral arteries: The left vertebral artery is dominant. The vertebral arteries are patent within the neck without significant stenosis. Skeleton: No acute bony abnormality or aggressive osseous lesion. Sequela of prior C6-C7 anterior and posterior fusion. Chronic fracture deformity of the T1 and T2 spinous processes. Other neck: No neck mass or cervical lymphadenopathy. Upper chest: No consolidation within the imaged lung apices. Review of the MIP images confirms the above findings CTA HEAD FINDINGS Anterior circulation: The intracranial internal carotid arteries are patent without significant stenosis. The M1 middle cerebral arteries are patent without significant stenosis. There is significant irregularity of a proximal to mid M2 superior division left MCA branch vessel with diminished and irregular enhancement seen more distally within this vessel. Findings are suspicious for a proximal to mid M2 occlusion or high-grade stenosis (series 510, image 31). No right M2 proximal branch occlusion or high-grade stenosis is identified. The right anterior cerebral artery is patent without significant stenosis. The left anterior cerebral artery is diffusely diminutive and this may be developmental. This vessel is poorly delineated beyond the A2/A3 junction and a high-grade stenosis within this vessel cannot be excluded. Posterior circulation: The intracranial vertebral arteries are patent without significant stenosis, as is the basilar artery. The posterior cerebral arteries are patent proximally without significant stenosis. Posterior communicating arteries are poorly delineated and may be hypoplastic or absent bilaterally. Venous sinuses: Within limitations of contrast timing, no convincing thrombus. Anatomic variants: As described MIP images confirms the above findings CT Brain Perfusion Findings: ASPECTS: CBF (<30%) Volume: 61mL Perfusion (Tmax>6.0s) volume: 44mL (left temporal  lobe) Mismatch Volume: 31mL Infarction Location:None identified These results were called by telephone at the time of interpretation on 04/06/2020 at 9:43 am to provider Benjiman Core , who verbally acknowledged these results. IMPRESSION: CTA neck: The common carotid, internal carotid and vertebral arteries are patent within the neck without hemodynamically significant stenosis. Mild atherosclerotic disease within the aortic arch and left carotid system as described. CTA head: 1. Significant irregularity of a proximal to mid M2 superior division left MCA branch vessel with diminished and irregular enhancement seen more distally within this vessel. Findings are suspicious for possible proximal to mid left M2 branch occlusion or high-grade stenosis. 2. The left anterior cerebral artery is diffusely diminutive and this may be developmental. This vessel is poorly delineated beyond the A2/A3 junction and a high-grade stenosis within this vessel cannot be excluded. CT perfusion head: The perfusion software identifies a 5 mL region of hypoperfused parenchyma utilizing the Tmax>6 seconds threshold within the left temporal lobe. The perfusion software identifies no core infarct. Reported mismatch volume: 5 mL. Electronically Signed   By: Jackey Loge DO   On: 04/06/2020 09:44   DG Chest 2 View  Result Date: 04/06/2020 CLINICAL DATA:  TIA. EXAM: CHEST - 2 VIEW COMPARISON:  Chest radiographs 03/18/2014 FINDINGS: Heart size within normal limits. There is no appreciable airspace consolidation within the lungs. No frank pulmonary edema. Evidence of pleural effusion or pneumothorax. No acute bony abnormality identified. Partially imaged fusion hardware within the lower cervical spine. IMPRESSION: No evidence of acute cardiopulmonary abnormality. Electronically Signed   By: Jackey Loge DO   On: 04/06/2020 11:46  CT Angio Neck W and/or Wo Contrast  Result Date: 04/06/2020 CLINICAL DATA:  Neuro deficit, acute, stroke  suspected. Additional history provided: Seizure and right-sided weakness. EXAM: CT ANGIOGRAPHY HEAD AND NECK CT PERFUSION BRAIN TECHNIQUE: Multidetector CT imaging of the head and neck was performed using the standard protocol during bolus administration of intravenous contrast. Multiplanar CT image reconstructions and MIPs were obtained to evaluate the vascular anatomy. Carotid stenosis measurements (when applicable) are obtained utilizing NASCET criteria, using the distal internal carotid diameter as the denominator. Multiphase CT imaging of the brain was performed following IV bolus contrast injection. Subsequent parametric perfusion maps were calculated using RAPID software. CONTRAST:  OMNIPAQUE IOHEXOL 350 MG/ML SOLN COMPARISON:  Noncontrast head CT performed earlier the same day. FINDINGS: CTA NECK FINDINGS Aortic arch: Standard aortic branching. Atherosclerotic plaque within the visualized aortic arch. No hemodynamically significant innominate or proximal subclavian artery stenosis. Right carotid system: CCA and ICA patent within the neck without stenosis. Left carotid system: CCA and ICA patent within the neck without significant stenosis (50% or greater). Mild mixed plaque within the carotid bifurcation and proximal ICA. Vertebral arteries: The left vertebral artery is dominant. The vertebral arteries are patent within the neck without significant stenosis. Skeleton: No acute bony abnormality or aggressive osseous lesion. Sequela of prior C6-C7 anterior and posterior fusion. Chronic fracture deformity of the T1 and T2 spinous processes. Other neck: No neck mass or cervical lymphadenopathy. Upper chest: No consolidation within the imaged lung apices. Review of the MIP images confirms the above findings CTA HEAD FINDINGS Anterior circulation: The intracranial internal carotid arteries are patent without significant stenosis. The M1 middle cerebral arteries are patent without significant stenosis. There  is significant irregularity of a proximal to mid M2 superior division left MCA branch vessel with diminished and irregular enhancement seen more distally within this vessel. Findings are suspicious for a proximal to mid M2 occlusion or high-grade stenosis (series 510, image 31). No right M2 proximal branch occlusion or high-grade stenosis is identified. The right anterior cerebral artery is patent without significant stenosis. The left anterior cerebral artery is diffusely diminutive and this may be developmental. This vessel is poorly delineated beyond the A2/A3 junction and a high-grade stenosis within this vessel cannot be excluded. Posterior circulation: The intracranial vertebral arteries are patent without significant stenosis, as is the basilar artery. The posterior cerebral arteries are patent proximally without significant stenosis. Posterior communicating arteries are poorly delineated and may be hypoplastic or absent bilaterally. Venous sinuses: Within limitations of contrast timing, no convincing thrombus. Anatomic variants: As described MIP images confirms the above findings CT Brain Perfusion Findings: ASPECTS: CBF (<30%) Volume: 25mL Perfusion (Tmax>6.0s) volume: 45mL (left temporal lobe) Mismatch Volume: 81mL Infarction Location:None identified These results were called by telephone at the time of interpretation on 04/06/2020 at 9:43 am to provider Benjiman Core , who verbally acknowledged these results. IMPRESSION: CTA neck: The common carotid, internal carotid and vertebral arteries are patent within the neck without hemodynamically significant stenosis. Mild atherosclerotic disease within the aortic arch and left carotid system as described. CTA head: 1. Significant irregularity of a proximal to mid M2 superior division left MCA branch vessel with diminished and irregular enhancement seen more distally within this vessel. Findings are suspicious for possible proximal to mid left M2 branch occlusion  or high-grade stenosis. 2. The left anterior cerebral artery is diffusely diminutive and this may be developmental. This vessel is poorly delineated beyond the A2/A3 junction and a high-grade stenosis within this  vessel cannot be excluded. CT perfusion head: The perfusion software identifies a 5 mL region of hypoperfused parenchyma utilizing the Tmax>6 seconds threshold within the left temporal lobe. The perfusion software identifies no core infarct. Reported mismatch volume: 5 mL. Electronically Signed   By: Jackey Loge DO   On: 04/06/2020 09:44   MR BRAIN WO CONTRAST  Result Date: 04/06/2020 CLINICAL DATA:  Right-sided numbness.  Blurred vision. EXAM: MRI HEAD WITHOUT CONTRAST TECHNIQUE: Multiplanar, multiecho pulse sequences of the brain and surrounding structures were obtained without intravenous contrast. COMPARISON:  CTA head and neck 04/06/2020 FINDINGS: Brain: Negative for acute infarct. No significant chronic infarct, hemorrhage, or mass lesion. Ventricle size is normal.  No edema or fluid collection. Image quality degraded by motion. Vascular: Normal arterial flow voids Skull and upper cervical spine: No focal skeletal lesion. Sinuses/Orbits: Mild mucosal edema paranasal sinuses. Negative orbit Other: None IMPRESSION: Negative for acute infarct. Motion degraded study.  No significant chronic ischemia. Electronically Signed   By: Marlan Palau M.D.   On: 04/06/2020 11:06   CT CEREBRAL PERFUSION W CONTRAST  Result Date: 04/06/2020 CLINICAL DATA:  Neuro deficit, acute, stroke suspected. Additional history provided: Seizure and right-sided weakness. EXAM: CT ANGIOGRAPHY HEAD AND NECK CT PERFUSION BRAIN TECHNIQUE: Multidetector CT imaging of the head and neck was performed using the standard protocol during bolus administration of intravenous contrast. Multiplanar CT image reconstructions and MIPs were obtained to evaluate the vascular anatomy. Carotid stenosis measurements (when applicable) are  obtained utilizing NASCET criteria, using the distal internal carotid diameter as the denominator. Multiphase CT imaging of the brain was performed following IV bolus contrast injection. Subsequent parametric perfusion maps were calculated using RAPID software. CONTRAST:  OMNIPAQUE IOHEXOL 350 MG/ML SOLN COMPARISON:  Noncontrast head CT performed earlier the same day. FINDINGS: CTA NECK FINDINGS Aortic arch: Standard aortic branching. Atherosclerotic plaque within the visualized aortic arch. No hemodynamically significant innominate or proximal subclavian artery stenosis. Right carotid system: CCA and ICA patent within the neck without stenosis. Left carotid system: CCA and ICA patent within the neck without significant stenosis (50% or greater). Mild mixed plaque within the carotid bifurcation and proximal ICA. Vertebral arteries: The left vertebral artery is dominant. The vertebral arteries are patent within the neck without significant stenosis. Skeleton: No acute bony abnormality or aggressive osseous lesion. Sequela of prior C6-C7 anterior and posterior fusion. Chronic fracture deformity of the T1 and T2 spinous processes. Other neck: No neck mass or cervical lymphadenopathy. Upper chest: No consolidation within the imaged lung apices. Review of the MIP images confirms the above findings CTA HEAD FINDINGS Anterior circulation: The intracranial internal carotid arteries are patent without significant stenosis. The M1 middle cerebral arteries are patent without significant stenosis. There is significant irregularity of a proximal to mid M2 superior division left MCA branch vessel with diminished and irregular enhancement seen more distally within this vessel. Findings are suspicious for a proximal to mid M2 occlusion or high-grade stenosis (series 510, image 31). No right M2 proximal branch occlusion or high-grade stenosis is identified. The right anterior cerebral artery is patent without significant  stenosis. The left anterior cerebral artery is diffusely diminutive and this may be developmental. This vessel is poorly delineated beyond the A2/A3 junction and a high-grade stenosis within this vessel cannot be excluded. Posterior circulation: The intracranial vertebral arteries are patent without significant stenosis, as is the basilar artery. The posterior cerebral arteries are patent proximally without significant stenosis. Posterior communicating arteries are poorly delineated and  may be hypoplastic or absent bilaterally. Venous sinuses: Within limitations of contrast timing, no convincing thrombus. Anatomic variants: As described MIP images confirms the above findings CT Brain Perfusion Findings: ASPECTS: CBF (<30%) Volume: 24mL Perfusion (Tmax>6.0s) volume: 18mL (left temporal lobe) Mismatch Volume: 75mL Infarction Location:None identified These results were called by telephone at the time of interpretation on 04/06/2020 at 9:43 am to provider Davonna Belling , who verbally acknowledged these results. IMPRESSION: CTA neck: The common carotid, internal carotid and vertebral arteries are patent within the neck without hemodynamically significant stenosis. Mild atherosclerotic disease within the aortic arch and left carotid system as described. CTA head: 1. Significant irregularity of a proximal to mid M2 superior division left MCA branch vessel with diminished and irregular enhancement seen more distally within this vessel. Findings are suspicious for possible proximal to mid left M2 branch occlusion or high-grade stenosis. 2. The left anterior cerebral artery is diffusely diminutive and this may be developmental. This vessel is poorly delineated beyond the A2/A3 junction and a high-grade stenosis within this vessel cannot be excluded. CT perfusion head: The perfusion software identifies a 5 mL region of hypoperfused parenchyma utilizing the Tmax>6 seconds threshold within the left temporal lobe. The perfusion  software identifies no core infarct. Reported mismatch volume: 5 mL. Electronically Signed   By: Kellie Simmering DO   On: 04/06/2020 09:44   EEG adult  Result Date: 04/07/2020 Lora Havens, MD     04/07/2020  6:00 PM Patient Name: SANUEL LADNIER MRN: [id number] Epilepsy Attending: Lora Havens Referring Physician/Provider: Dr. Barton Dubois Date: 04/07/2020 Duration: 23.34 mins Patient history: 59 year old male with history of seizures who presented with seizure and right-sided weakness.  EEG to evaluate for seizures. Level of alertness: Awake AEDs during EEG study: Keppra Technical aspects: This EEG study was done with scalp electrodes positioned according to the 10-20 International system of electrode placement. Electrical activity was acquired at a sampling rate of 500Hz  and reviewed with a high frequency filter of 70Hz  and a low frequency filter of 1Hz . EEG data were recorded continuously and digitally stored. Description: The posterior dominant rhythm consists of 10 Hz activity of moderate voltage (25-35 uV) seen predominantly in posterior head regions, symmetric and reactive to eye opening and eye closing.  Physiology photic driving was seen during photic stimulation.  Hyperventilation was not performed.   IMPRESSION: This study is within normal limits. No seizures or epileptiform discharges were seen throughout the recording. Lora Havens   ECHOCARDIOGRAM COMPLETE  Result Date: 04/06/2020    ECHOCARDIOGRAM REPORT   Patient Name:   JONTAVIUS RABALAIS Date of Exam: 04/06/2020 Medical Rec #:  [id number]         Height:       66.0 in Accession #:    [id number]        Weight:       170.0 lb Date of Birth:  26-Jul-1961         BSA:          1.866 m Patient Age:    59 years          BP:           150/108 mmHg Patient Gender: M                 HR:           80 bpm. Exam Location:  [facility] Procedure: 2D Echo, Cardiac Doppler and Color Doppler Indications:    TIA  435.9 / G45.9  History:        Patient  has no prior history of Echocardiogram examinations.                 Risk Factors:Hypertension and Current Smoker.  Sonographer:    Celesta Gentile RCS Referring Phys: [phone] Arvle Grabe IMPRESSIONS  1. Left ventricular ejection fraction, by estimation, is 60 to 65%. The left ventricle has normal function. The left ventricle has no regional wall motion abnormalities. There is mild left ventricular hypertrophy. Left ventricular diastolic parameters were normal.  2. Right ventricular systolic function is normal. The right ventricular size is normal.  3. The mitral valve is normal in structure. No evidence of mitral valve regurgitation. No evidence of mitral stenosis.  4. The aortic valve is tricuspid. Aortic valve regurgitation is not visualized. No aortic stenosis is present.  5. The inferior vena cava is normal in size with greater than 50% respiratory variability, suggesting right atrial pressure of 3 mmHg. FINDINGS  Left Ventricle: Left ventricular ejection fraction, by estimation, is 60 to 65%. The left ventricle has normal function. The left ventricle has no regional wall motion abnormalities. The left ventricular internal cavity size was normal in size. There is  mild left ventricular hypertrophy. Left ventricular diastolic parameters were normal. Right Ventricle: The right ventricular size is normal. No increase in right ventricular wall thickness. Right ventricular systolic function is normal. Left Atrium: Left atrial size was normal in size. Right Atrium: Right atrial size was normal in size. Pericardium: There is no evidence of pericardial effusion. Mitral Valve: The mitral valve is normal in structure. No evidence of mitral valve regurgitation. No evidence of mitral valve stenosis. Tricuspid Valve: The tricuspid valve is normal in structure. Tricuspid valve regurgitation is not demonstrated. No evidence of tricuspid stenosis. Aortic Valve: The aortic valve is tricuspid. Aortic valve regurgitation is not  visualized. No aortic stenosis is present. Aortic valve mean gradient measures 3.1 mmHg. Aortic valve peak gradient measures 5.8 mmHg. Aortic valve area, by VTI measures 3.29 cm. Pulmonic Valve: The pulmonic valve was not well visualized. Pulmonic valve regurgitation is not visualized. No evidence of pulmonic stenosis. Aorta: The aortic root is normal in size and structure. Pulmonary Artery: Indeterminant PASP, inadequate TR jet. Venous: The inferior vena cava is normal in size with greater than 50% respiratory variability, suggesting right atrial pressure of 3 mmHg. IAS/Shunts: No atrial level shunt detected by color flow Doppler.  LEFT VENTRICLE PLAX 2D LVIDd:         4.19 cm  Diastology LVIDs:         2.28 cm  LV e' lateral:   11.70 cm/s LV PW:         1.01 cm  LV E/e' lateral: 6.9 LV IVS:        1.37 cm  LV e' medial:    7.07 cm/s LVOT diam:     2.20 cm  LV E/e' medial:  11.5 LV SV:         80 LV SV Index:   43 LVOT Area:     3.80 cm  RIGHT VENTRICLE RV S prime:     11.50 cm/s TAPSE (M-mode): 2.1 cm LEFT ATRIUM             Index       RIGHT ATRIUM           Index LA diam:        2.50 cm 1.34 cm/m  RA Area:  19.20 cm LA Vol (A2C):   72.2 ml 38.69 ml/m RA Volume:   57.90 ml  31.02 ml/m LA Vol (A4C):   33.4 ml 17.90 ml/m LA Biplane Vol: 51.0 ml 27.33 ml/m  AORTIC VALVE AV Area (Vmax):    3.54 cm AV Area (Vmean):   3.11 cm AV Area (VTI):     3.29 cm AV Vmax:           120.41 cm/s AV Vmean:          82.364 cm/s AV VTI:            0.244 m AV Peak Grad:      5.8 mmHg AV Mean Grad:      3.1 mmHg LVOT Vmax:         112.00 cm/s LVOT Vmean:        67.400 cm/s LVOT VTI:          0.211 m LVOT/AV VTI ratio: 0.87  AORTA Ao Root diam: 3.20 cm MITRAL VALVE MV Area (PHT): 3.02 cm    SHUNTS MV Decel Time: 251 msec    Systemic VTI:  0.21 m MV E velocity: 81.20 cm/s  Systemic Diam: 2.20 cm MV A velocity: 74.40 cm/s MV E/A ratio:  1.09 Dina RichJonathan Branch MD Electronically signed by Dina RichJonathan Branch MD Signature Date/Time:  04/06/2020/2:36:38 PM    Final    CT HEAD CODE STROKE WO CONTRAST  Result Date: 04/06/2020 CLINICAL DATA:  Code stroke. Neuro deficit, acute, stroke suspected. Additional history provided: Acute right-sided weakness at 7:30 with seizure. EXAM: CT HEAD WITHOUT CONTRAST TECHNIQUE: Contiguous axial images were obtained from the base of the skull through the vertex without intravenous contrast. COMPARISON:  No pertinent prior studies available for comparison. FINDINGS: Brain: Cerebral volume is normal for age. There is no acute intracranial hemorrhage. No demarcated cortical infarct is identified. No extra-axial fluid collection. No evidence of intracranial mass. No midline shift. Vascular: No hyperdense vessel. Skull: Normal. Negative for fracture or focal lesion. Sinuses/Orbits: Visualized orbits show no acute finding. No significant paranasal sinus disease or mastoid effusion at the imaged levels. ASPECTS (Alberta Stroke Program Early CT Score) - Ganglionic level infarction (caudate, lentiform nuclei, internal capsule, insula, M1-M3 cortex): 7 - Supraganglionic infarction (M4-M6 cortex): 3 Total score (0-10 with 10 being normal): 10 These results were called by telephone at the time of interpretation on 04/06/2020 at 8:44 am to provider Benjiman CoreNATHAN PICKERING , who verbally acknowledged these results. IMPRESSION: No evidence of acute intracranial hemorrhage or acute demarcated cortical infarction. ASPECTS is 10 Electronically Signed   By: Jackey LogeKyle  Golden DO   On: 04/06/2020 08:44    Microbiology: Recent Results (from the past 240 hour(s))  SARS Coronavirus 2 by RT PCR (hospital order, performed [facility] lab) Nasopharyngeal Nasopharyngeal Swab     Status: None   Collection Time: 04/06/20 11:45 AM   Specimen: Nasopharyngeal Swab  Result Value Ref Range Status   SARS Coronavirus 2 NEGATIVE NEGATIVE Final    Comment: (NOTE) SARS-CoV-2 target nucleic acids are NOT DETECTED.  The SARS-CoV-2 RNA is  generally detectable in upper and lower respiratory specimens during the acute phase of infection. The lowest concentration of SARS-CoV-2 viral copies this assay can detect is 250 copies / mL. A negative result does not preclude SARS-CoV-2 infection and should not be used as the sole basis for treatment or other patient management decisions.  A negative result may occur with improper specimen collection / handling, submission of specimen other  than nasopharyngeal swab, presence of viral mutation(s) within the areas targeted by this assay, and inadequate number of viral copies (<250 copies / mL). A negative result must be combined with clinical observations, patient history, and epidemiological information.  Fact Sheet for Patients:   BoilerBrush.com.cy  Fact Sheet for Healthcare Providers: https://pope.com/  This test is not yet approved or  cleared by the [place] FDA and has been authorized for detection and/or diagnosis of SARS-CoV-2 by FDA under an Emergency Use Authorization (EUA).  This EUA will remain in effect (meaning this test can be used) for the duration of the COVID-19 declaration under Section 564(b)(1) of the Act, 21 U.S.C. section 360bbb-3(b)(1), unless the authorization is terminated or revoked sooner.  Performed at [facility], [address] [place], [place] [address]      Labs: Basic Metabolic Panel: Recent Labs  Lab 04/06/20 0835 04/06/20 0843  NA 138 140  K 3.6 3.7  CL 104 105  CO2 23  --   GLUCOSE 123* 122*  BUN 12 11  CREATININE 0.97 1.00  CALCIUM 8.8*  --    Liver Function Tests: Recent Labs  Lab 04/06/20 0835  AST 21  ALT 18  ALKPHOS 69  BILITOT 0.2*  PROT 7.2  ALBUMIN 3.8   CBC: Recent Labs  Lab 04/06/20 0835 04/06/20 0843  WBC 5.7  --   NEUTROABS 3.8  --   HGB 15.5 16.0  HCT 46.5 47.0  MCV 104.5*  --   PLT 300  --     CBG: Recent Labs  Lab 04/06/20 0837  GLUCAP  123*    Signed:  Vassie Loll MD.  [facility] 04/07/2020, 6:10 PM

## 2020-04-07 NOTE — Plan of Care (Signed)
  Problem: Education: Goal: Knowledge of General Education information will improve Description: Including pain rating scale, medication(s)/side effects and non-pharmacologic comfort measures Outcome: Progressing   Problem: Health Behavior/Discharge Planning: Goal: Ability to manage health-related needs will improve Outcome: Progressing   Problem: Clinical Measurements: Goal: Ability to maintain clinical measurements within normal limits will improve Outcome: Progressing Goal: Will remain free from infection Outcome: Progressing   Problem: Education: Goal: Knowledge of disease or condition will improve Outcome: Progressing Goal: Knowledge of secondary prevention will improve Outcome: Progressing Goal: Knowledge of patient specific risk factors addressed and post discharge goals established will improve Outcome: Progressing   Problem: Self-Care: Goal: Ability to participate in self-care as condition permits will improve Outcome: Progressing

## 2020-04-07 NOTE — Progress Notes (Signed)
OT Cancellation Note  Patient Details Name: MISHON BLUBAUGH MRN: [id number] DOB: 07/16/1961   Cancelled Treatment:    Reason Eval/Treat Not Completed: OT screened, no needs identified, will sign off. Pt screened for OT needs, MRI negative for acute changes. Pt is mod independent with mobility and ADLs. No further OT needs at this time.   Ezra Sites, OTR/L  [phone] 04/07/2020, 5:34 PM

## 2020-04-07 NOTE — Care Management Obs Status (Signed)
MEDICARE OBSERVATION STATUS NOTIFICATION   Patient Details  Name: David Watson MRN: [id number] Date of Birth: 1961-07-22   Medicare Observation Status Notification Given:  Yes    Corey Harold 04/07/2020, 2:03 PM

## 2020-04-07 NOTE — Progress Notes (Signed)
EEG completed, results pending. 

## 2020-04-07 NOTE — TOC Progression Note (Signed)
Transition of Care [facility]) - Progression Note    Patient Details  Name: David Watson MRN: [id number] Date of Birth: Apr 03, 1961  [facility] CM/SW Contact  Leitha Bleak, RN Phone Number: 04/07/2020, 10:36 AM  Clinical Narrative:   Patient admitted with stroke like symptoms. EEG pending , possible discharge today. PT evaluation indicates no needs. MD asking TOC to give patient SA resources. Patient states he goes to AA meeting two times a week. States he relapsed. He agreed to take the list, TOC reviewed the options, he will think about it and return to [employer]    Expected Discharge Plan: Home/Self Care Barriers to Discharge: Continued Medical Work up  Expected Discharge Plan and Services Expected Discharge Plan: Home/Self Care

## 2020-04-07 NOTE — Evaluation (Signed)
Physical Therapy Evaluation Patient Details Name: David Watson MRN: [id number] DOB: 04-17-1961 Today's Date: 04/07/2020   History of Present Illness  David Watson is a 59 y.o. male with medical history significant of seizure, tobacco abuse, alcohol use and HTN; who presented to ED after experiencing seizure event and right sided weakness. Last seen normal prior to going to bed, reported events happened at 7:45 am. patient expressed not taking any meds currently and that his last seizure was in [place] approx 3 years ago.Patient denies traumas, bleeding, dysuria, cough, CP, SOB, nausea or vomiting. He reported some blurred vision.    Clinical Impression  Patient functioning at baseline for functional mobility and gait.  Plan:  Patient discharged from physical therapy to care of nursing for ambulation daily as tolerated for length of stay.     Follow Up Recommendations No PT follow up    Equipment Recommendations  None recommended by PT    Recommendations for Other Services       Precautions / Restrictions Precautions Precautions: None Restrictions Weight Bearing Restrictions: No      Mobility  Bed Mobility Overal bed mobility: Independent                Transfers Overall transfer level: Modified independent                  Ambulation/Gait Ambulation/Gait assistance: Modified independent (Device/Increase time) Gait Distance (Feet): 200 Feet Assistive device: None Gait Pattern/deviations: WFL(Within Functional Limits);Antalgic Gait velocity: slightly decreased   General Gait Details: grossly WLF except slightly antalgic on RLE which is baseline per patient, demonstrates good return for ambulation on level, inclined and declined surfaces without loss of balance  Stairs            Wheelchair Mobility    Modified Rankin (Stroke Patients Only)       Balance Overall balance assessment: Modified Independent                                            Pertinent Vitals/Pain Pain Assessment: No/denies pain    Home Living Family/patient expects to be discharged to:: Private residence Living Arrangements: Alone Available Help at Discharge: Family;Available 24 hours/day Type of Home: Mobile home Home Access: Ramped entrance     Home Layout: One level Home Equipment: Cane - single point      Prior Function Level of Independence: Independent         Comments: Tourist information centre manager, drives     Higher education careers adviser        Extremity/Trunk Assessment   Upper Extremity Assessment Upper Extremity Assessment: Overall WFL for tasks assessed    Lower Extremity Assessment Lower Extremity Assessment: Overall WFL for tasks assessed    Cervical / Trunk Assessment Cervical / Trunk Assessment: Normal  Communication   Communication: No difficulties  Cognition Arousal/Alertness: Awake/alert Behavior During Therapy: WFL for tasks assessed/performed Overall Cognitive Status: Within Functional Limits for tasks assessed                                        General Comments      Exercises     Assessment/Plan    PT Assessment Patent does not need any further PT services  PT Problem List  PT Treatment Interventions      PT Goals (Current goals can be found in the Care Plan section)  Acute Rehab PT Goals Patient Stated Goal: return home with family to assist PT Goal Formulation: With patient Time For Goal Achievement: 04/07/20 Potential to Achieve Goals: Good    Frequency     Barriers to discharge        Co-evaluation               AM-PAC PT "6 Clicks" Mobility  Outcome Measure Help needed turning from your back to your side while in a flat bed without using bedrails?: None Help needed moving from lying on your back to sitting on the side of a flat bed without using bedrails?: None Help needed moving to and from a bed to a chair (including a wheelchair)?:  None Help needed standing up from a chair using your arms (e.g., wheelchair or bedside chair)?: None Help needed to walk in hospital room?: None Help needed climbing 3-5 steps with a railing? : None 6 Click Score: 24    End of Session   Activity Tolerance: Patient tolerated treatment well Patient left: in bed Nurse Communication: Mobility status PT Visit Diagnosis: Unsteadiness on feet (R26.81);Other abnormalities of gait and mobility (R26.89);Muscle weakness (generalized) (M62.81)    Time: 9147-8295 PT Time Calculation (min) (ACUTE ONLY): 13 min   Charges:   PT Evaluation $PT Eval Low Complexity: 1 Low PT Treatments $Therapeutic Activity: 8-22 mins        10:46 AM, 04/07/20 Lonell Grandchild, MPT Physical Therapist with [facility] [phone] office [phone] mobile phone

## 2020-04-07 NOTE — Progress Notes (Signed)
Nsg Discharge Note  Admit Date:  04/06/2020 Discharge date: 04/07/2020   Al Corpus to be D/C'd Home per MD order.  AVS completed.  Copy for chart, and copy for patient signed, and dated. Removed IV-clean, dry, intact. Reviewed d/c paperwork with patient. Reviewed BEFAST with patient. Answered all questions. Wheeled stable patient and belongings to main entrance where he was picked up by his daughter to d/c to home. Patient/caregiver able to verbalize understanding.  Discharge Medication: Allergies as of 04/07/2020      Reactions   Lisinopril Itching      Medication List    STOP taking these medications   HYDROcodone-acetaminophen 5-325 MG tablet Commonly known as: Norco   ibuprofen 600 MG tablet Commonly known as: ADVIL     TAKE these medications   acetaminophen 325 MG tablet Commonly known as: TYLENOL Take 325 mg by mouth every 6 (six) hours as needed for headache.   albuterol 108 (90 Base) MCG/ACT inhaler Commonly known as: VENTOLIN HFA Inhale 1-2 puffs into the lungs every 6 (six) hours as needed for wheezing or shortness of breath.   aspirin EC 81 MG tablet Take 1 tablet (81 mg total) by mouth daily. Swallow whole.   cyanocobalamin 1000 MCG/ML injection Commonly known as: (VITAMIN B-12) Indurated 1 mL daily for 5 more days; then weekly for a month and then monthly after that.   levETIRAcetam 500 MG tablet Commonly known as: KEPPRA Take 1 tablet (500 mg total) by mouth 2 (two) times daily.   losartan 25 MG tablet Commonly known as: Cozaar Take 1 tablet (25 mg total) by mouth daily.   nicotine 21 mg/24hr patch Commonly known as: NICODERM CQ - dosed in mg/24 hours Place 1 patch (21 mg total) onto the skin daily. Start taking on: April 08, 2020       Discharge Assessment: Vitals:   04/07/20 0800 04/07/20 1348  BP: (!) 149/96 (!) 148/97  Pulse: 78 65  Resp: 18 16  Temp: 98.3 F (36.8 C) 98.3 F (36.8 C)  SpO2: 99% 100%   Skin clean, dry and intact  without evidence of skin break down, no evidence of skin tears noted. IV catheter discontinued intact. Site without signs and symptoms of complications - no redness or edema noted at insertion site, patient denies c/o pain - only slight tenderness at site.  Dressing with slight pressure applied.  D/c Instructions-Education: Discharge instructions given to patient/family with verbalized understanding. D/c education completed with patient/family including follow up instructions, medication list, d/c activities limitations if indicated, with other d/c instructions as indicated by MD - patient able to verbalize understanding, all questions fully answered. Patient instructed to return to ED, call 911, or call MD for any changes in condition.  Patient escorted via WC, and D/C home via private auto.  Karolee Ohs, RN 04/07/2020 7:36 PM

## 2020-04-07 NOTE — Procedures (Signed)
Patient Name: David Watson  MRN: [id number]  Epilepsy Attending: Charlsie Quest  Referring Physician/Provider: Dr. Vassie Loll Date: 04/07/2020  Duration: 23.34 mins  Patient history: 59 year old male with history of seizures who presented with seizure and right-sided weakness.  EEG to evaluate for seizures.  Level of alertness: Awake  AEDs during EEG study: Keppra  Technical aspects: This EEG study was done with scalp electrodes positioned according to the 10-20 International system of electrode placement. Electrical activity was acquired at a sampling rate of 500Hz  and reviewed with a high frequency filter of 70Hz  and a low frequency filter of 1Hz . EEG data were recorded continuously and digitally stored.   Description: The posterior dominant rhythm consists of 10 Hz activity of moderate voltage (25-35 uV) seen predominantly in posterior head regions, symmetric and reactive to eye opening and eye closing.  Physiology photic driving was seen during photic stimulation.  Hyperventilation was not performed.     IMPRESSION: This study is within normal limits. No seizures or epileptiform discharges were seen throughout the recording.   Winifred Bodiford 

## 2020-04-07 NOTE — Progress Notes (Signed)
SLP Cancellation Note  Patient Details Name: David Watson MRN: [id number] DOB: [birth year]   Cancelled treatment:       Reason Eval/Treat Not Completed: SLP screened, no needs identified, will sign off; MRI negative for acute changes.  Thank you,  Havery Moros, CCC-SLP [phone]    Aleksander Edmiston 04/07/2020, 3:14 PM

## 2020-06-28 IMAGING — MR MR HEAD W/O CM
9 of 10 series · 35 of 48 positions shown · non-contrast
Comparison: CTA head and neck 04/06/2020

CLINICAL DATA: Right-sided numbness.  Blurred vision.

EXAM:
MRI HEAD WITHOUT CONTRAST
TECHNIQUE: Multiplanar, multiecho pulse sequences of the brain and surrounding
structures were obtained without intravenous contrast.

[Series 2: T1 · sagittal · 5.0mm · 0.42mm/px · 3 of 20 slices shown (1 of 2)]
[im 1/20]
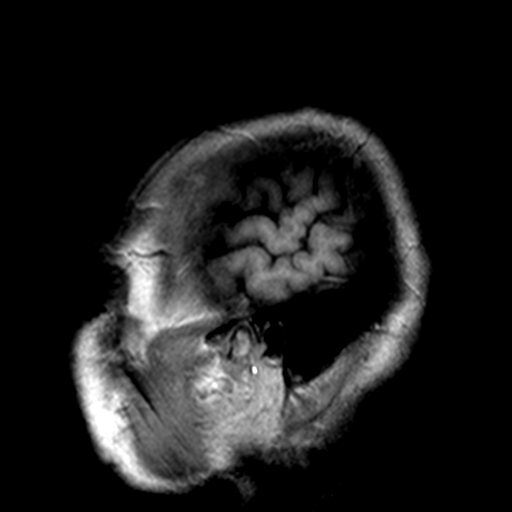
[im 10/20]
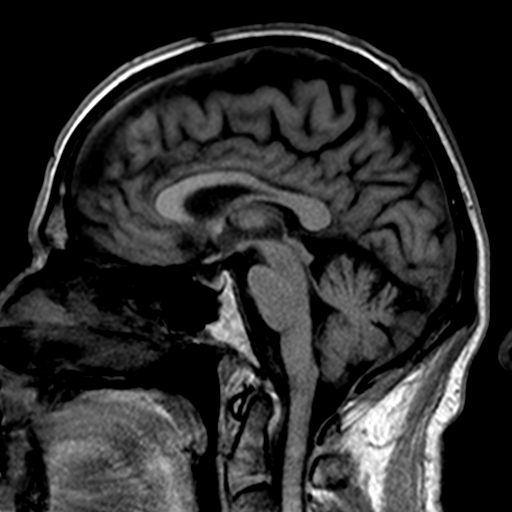
[im 20/20]
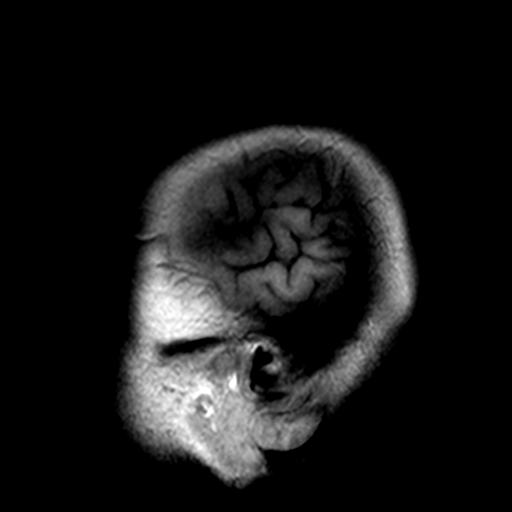

[Series 3: DWI · axial · 3.0mm · 0.82mm/px · z∈[-20,+140]mm · 6 of 55 slices shown (1 of 2)]
[im 1/55]
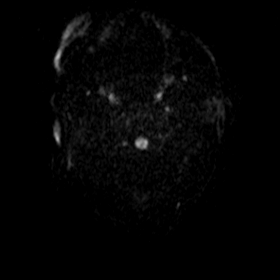
[im 11/55]
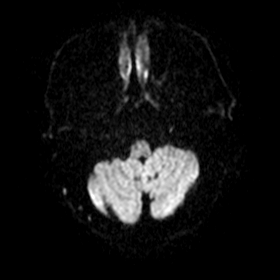
[im 22/55]
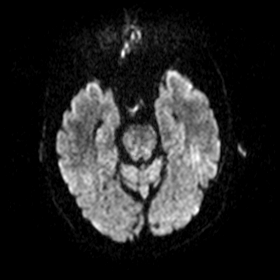
[im 33/55]
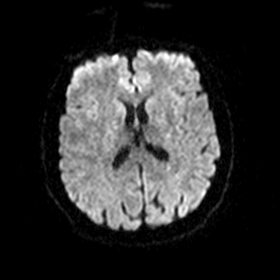
[im 44/55]
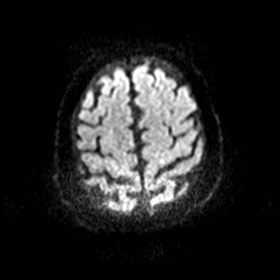
[im 55/55]
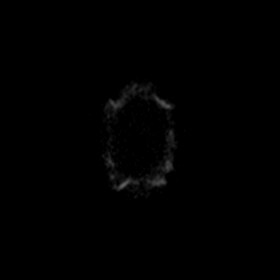

[Series 4: ax dwi_adc · axial · 3.0mm · 0.82mm/px · 1 of 55 slices shown]
[im 1/55]
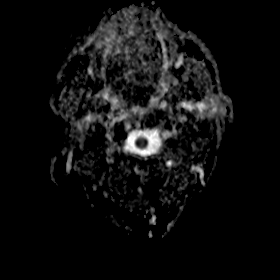

[Series 5: DWI · coronal · 5.0mm · 0.52mm/px · 4 of 34 slices shown (2 of 2)]
[im 1/34]
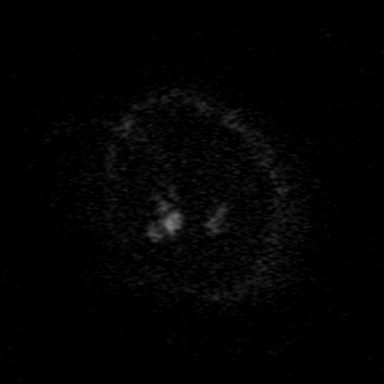
[im 12/34]
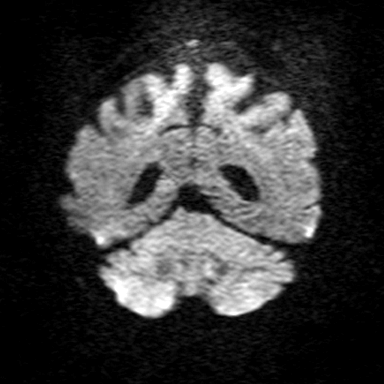
[im 23/34]
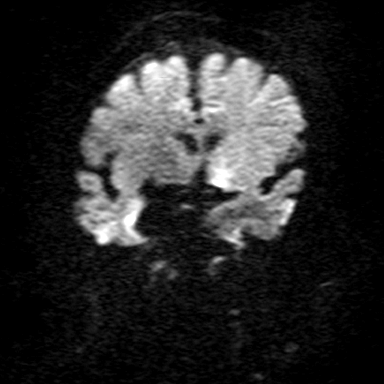
[im 34/34]
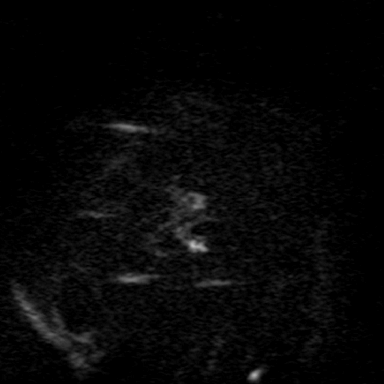

[Series 7: T2 · axial · 5.0mm · 0.75mm/px · z∈[-10,+131]mm · 3 of 23 slices shown (1 of 3)]
[im 1/23]
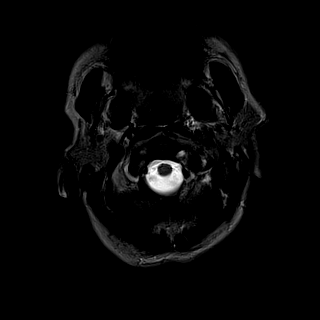
[im 12/23]
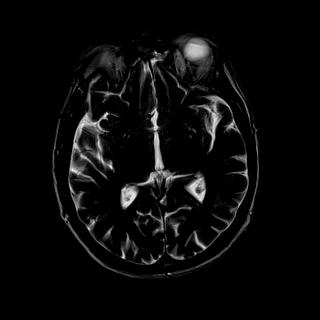
[im 23/23]
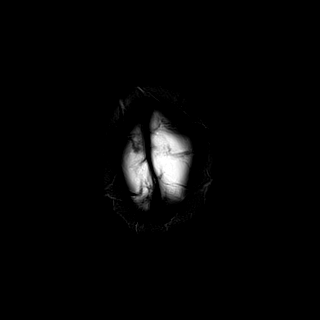

[Series 8: FLAIR · axial · 3.0mm · 0.94mm/px · z∈[-8,+129]mm · 5 of 47 slices shown]
[im 1/47]
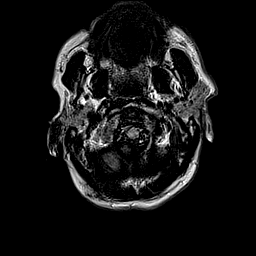
[im 12/47]
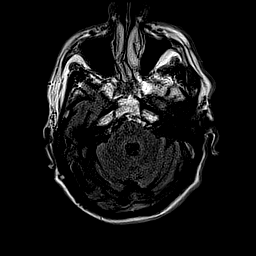
[im 24/47]
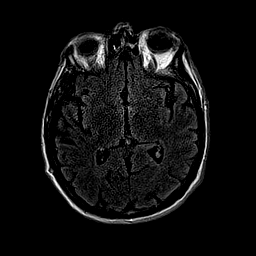
[im 35/47]
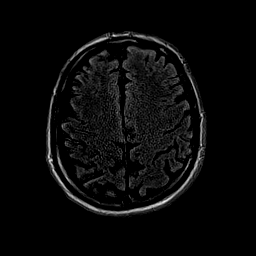
[im 47/47]
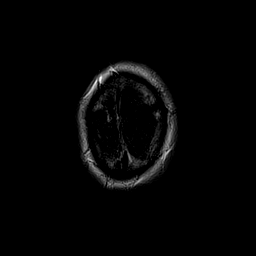

[Series 9: T2 · axial · 5.0mm · 0.45mm/px · z∈[-5,+124]mm · 2 of 21 slices shown (2 of 3)]
[im 1/21]
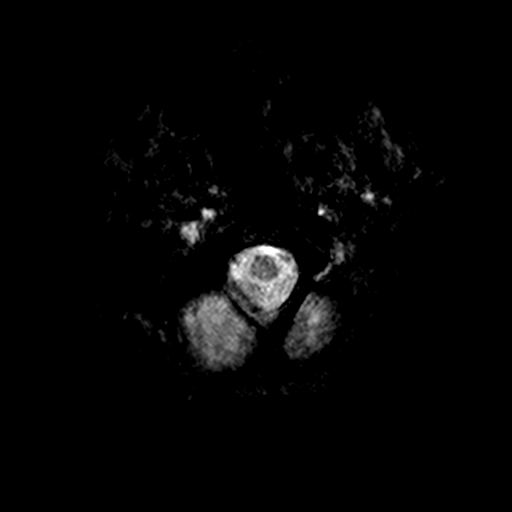
[im 21/21]
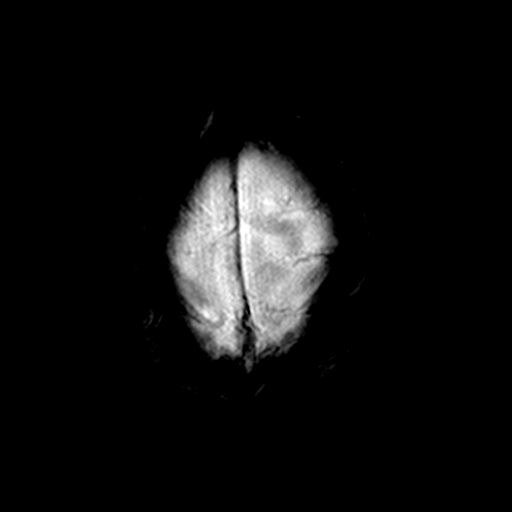

[Series 10: T1 · axial · 2.0mm · 0.47mm/px · z∈[-42,+162]mm · 8 of 104 slices shown (2 of 2)]
[im 1/104]
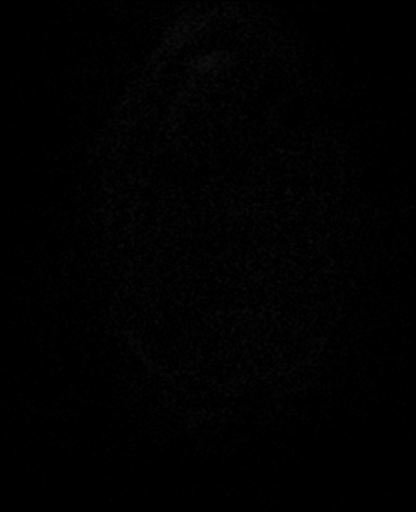
[im 19/104]
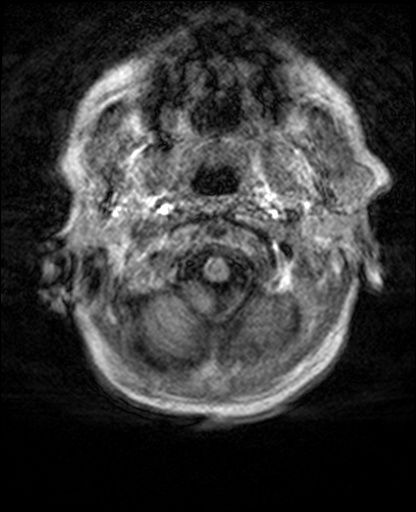
[im 29/104]
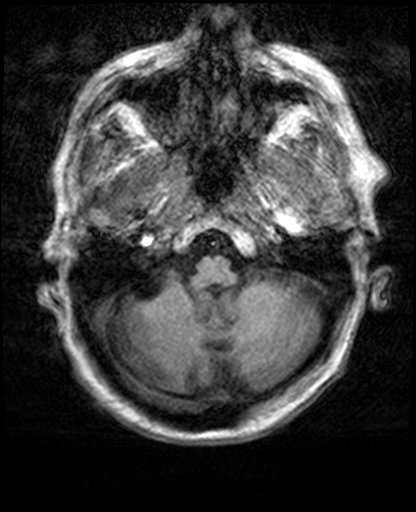
[im 47/104]
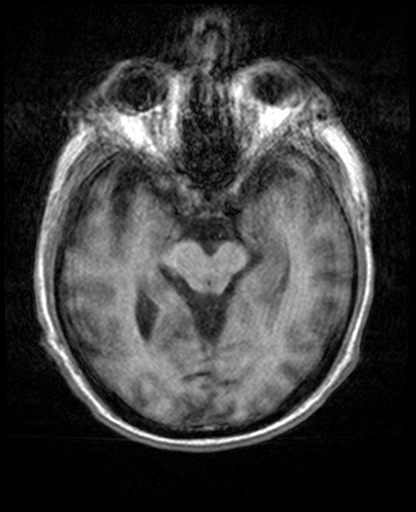
[im 57/104]
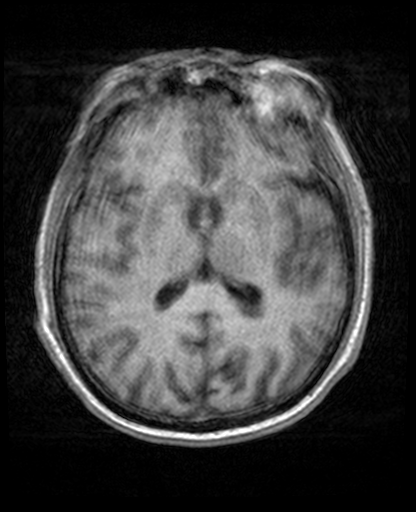
[im 75/104]
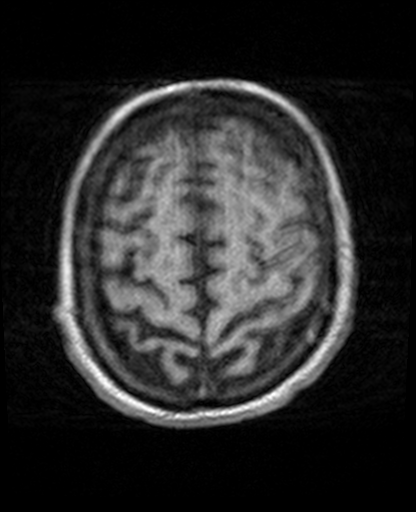
[im 85/104]
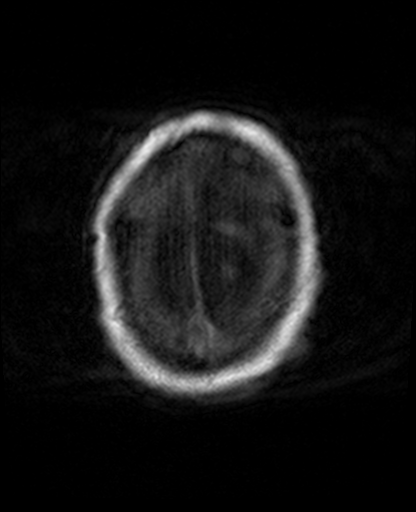
[im 104/104]
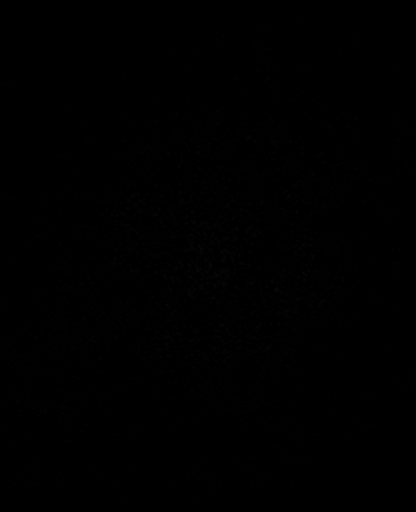

[Series 11: T2 · coronal · 5.0mm · 0.66mm/px · 3 of 28 slices shown (3 of 3)]
[im 1/28]
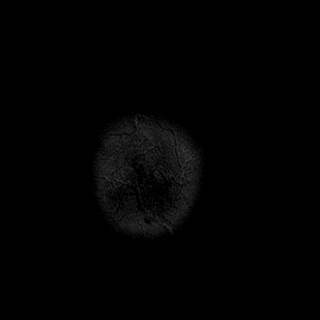
[im 14/28]
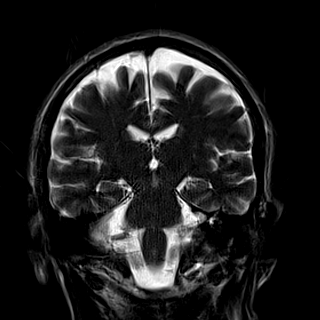
[im 28/28]
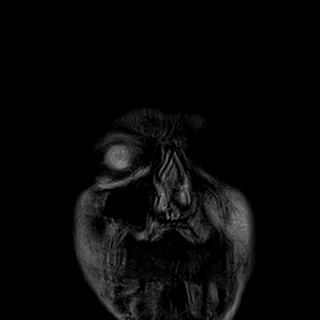

[35 of 48 positions shown; findings below may reference images not displayed]

FINDINGS: Brain: Negative for acute infarct. No significant chronic infarct,
hemorrhage, or mass lesion.

Ventricle size is normal.  No edema or fluid collection.

Image quality degraded by motion.

Vascular: Normal arterial flow voids

Skull and upper cervical spine: No focal skeletal lesion.

Sinuses/Orbits: Mild mucosal edema paranasal sinuses. Negative orbit

Other: None
IMPRESSION: Negative for acute infarct.

Motion degraded study.  No significant chronic ischemia.

## 2023-08-08 ENCOUNTER — Emergency Department (HOSPITAL_COMMUNITY): Payer: Medicare Other

## 2023-08-08 ENCOUNTER — Other Ambulatory Visit: Payer: Self-pay

## 2023-08-08 ENCOUNTER — Emergency Department (HOSPITAL_COMMUNITY)
Admission: EM | Admit: 2023-08-08 | Discharge: 2023-08-08 | Disposition: A | Discharge status: Home / Self Care | Payer: Medicare Other | Attending: Emergency Medicine | Admitting: Emergency Medicine

## 2023-08-08 ENCOUNTER — Encounter (HOSPITAL_COMMUNITY): Payer: Self-pay

## 2023-08-08 DIAGNOSIS — S80261A Insect bite (nonvenomous), right knee, initial encounter: Secondary | ICD-10-CM | POA: Insufficient documentation

## 2023-08-08 DIAGNOSIS — W57XXXA Bitten or stung by nonvenomous insect and other nonvenomous arthropods, initial encounter: Secondary | ICD-10-CM | POA: Insufficient documentation

## 2023-08-08 MED ORDER — IBUPROFEN 600 MG PO TABS: 600.0000 mg | ORAL_TABLET | Freq: Four times a day (QID) | ORAL | 0 refills | Status: AC | PRN

## 2023-08-08 MED ORDER — HYDROCODONE-ACETAMINOPHEN 5-325 MG PO TABS: 1.0000 | ORAL_TABLET | ORAL | 0 refills | Status: AC | PRN

## 2023-08-08 MED ORDER — HYDROCODONE-ACETAMINOPHEN 5-325 MG PO TABS
1.0000 | ORAL_TABLET | Freq: Once | ORAL | Status: AC
  Administered 2023-08-08: 1 via ORAL
  Filled 2023-08-08: qty 1

## 2023-08-08 MED ORDER — DOXYCYCLINE HYCLATE 100 MG PO TABS
100.0000 mg | ORAL_TABLET | Freq: Once | ORAL | Status: AC
  Administered 2023-08-08: 100 mg via ORAL
  Filled 2023-08-08: qty 1

## 2023-08-08 MED ORDER — DOXYCYCLINE HYCLATE 100 MG PO CAPS: 100.0000 mg | ORAL_CAPSULE | Freq: Two times a day (BID) | ORAL | 0 refills | Status: DC

## 2023-08-08 MED ORDER — HYDROCODONE-ACETAMINOPHEN 5-325 MG PO TABS: 1.0000 | ORAL_TABLET | ORAL | 0 refills | Status: DC | PRN

## 2023-08-08 NOTE — ED Notes (Signed)
Pt has an ulcer, yellow, center right of midline of right knee. Blister like placed around center ulcer. Redness around entire knee with swelling and tenderness. Pt states intermittent shooting pain/tingling that radiates above and towards foot. Pt able to bend knee but not without pain. Pt able to ambulate at this time

## 2023-08-08 NOTE — Discharge Instructions (Addendum)
Take the medications as prescribed. Return for recheck of this infection in 2 days (Saturday the 26th). If you develop a fever, have joint pain, the redness starts traveling up the leg, or new concern, return prior to Saturday.

## 2023-08-08 NOTE — ED Provider Notes (Signed)
[place] EMERGENCY DEPARTMENT AT [facility] Provider Note   CSN: [id number] Arrival date & time: 08/08/23  1905     History  Chief Complaint  Patient presents with   Insect Bite    REVEN RAPTIS is a 62 y.o. male.  Patient to ED with red, painful and swollen area to right knee x 2 days. He feels he has been bitten by an insect of some kind. No fever. No drainage. He denies joint pain. Not immunocompromised.   The history is provided by the patient and the spouse. No language interpreter was used.       Home Medications Prior to Admission medications   Medication Sig Start Date End Date Taking? Authorizing Provider  doxycycline (VIBRAMYCIN) 100 MG capsule Take 1 capsule (100 mg total) by mouth 2 (two) times daily. 08/08/23  Yes Marina Desire, Melvenia Beam, PA-C  ibuprofen (ADVIL) 600 MG tablet Take 1 tablet (600 mg total) by mouth every 6 (six) hours as needed. 08/08/23  Yes Elpidio Anis, PA-C  acetaminophen (TYLENOL) 325 MG tablet Take 325 mg by mouth every 6 (six) hours as needed for headache.    [provider]  albuterol (VENTOLIN HFA) 108 (90 Base) MCG/ACT inhaler Inhale 1-2 puffs into the lungs every 6 (six) hours as needed for wheezing or shortness of breath. 04/07/20   Vassie Loll, MD  cyanocobalamin (,VITAMIN B-12,) 1000 MCG/ML injection Indurated 1 mL daily for 5 more days; then weekly for a month and then monthly after that. 04/07/20   Vassie Loll, MD  HYDROcodone-acetaminophen (NORCO/VICODIN) 5-325 MG tablet Take 1-2 tablets by mouth every 4 (four) hours as needed. 08/08/23   Elpidio Anis, PA-C  levETIRAcetam (KEPPRA) 500 MG tablet Take 1 tablet (500 mg total) by mouth 2 (two) times daily. 04/07/20   Vassie Loll, MD  losartan (COZAAR) 25 MG tablet Take 1 tablet (25 mg total) by mouth daily. 04/07/20 04/07/21  Vassie Loll, MD  nicotine (NICODERM CQ - DOSED IN MG/24 HOURS) 21 mg/24hr patch Place 1 patch (21 mg total) onto the skin daily. 04/08/20    Vassie Loll, MD      Allergies    Lisinopril    Review of Systems   Review of Systems  Physical Exam Updated Vital Signs BP (!) 148/94   Pulse 79   Temp 98.1 F (36.7 C) (Oral)   Resp 16   Ht 5\' 8"  (1.727 m)   Wt 98 kg   SpO2 98%   BMI 32.84 kg/m  Physical Exam Constitutional:      Appearance: Normal appearance. He is not ill-appearing.  Musculoskeletal:        General: Swelling present. Normal range of motion.     Comments: Right knee is moderately swollen without joint effusion. No joint pain on ROM.   Skin:    General: Skin is warm and dry.     Comments: Small shallow ulceration to lateral aspect with surround erythema with diagmeter of approximately 4 cm. No fluctuance. No expanding redness. Calf and thigh nontender.   Neurological:     Mental Status: He is alert.     ED Results / Procedures / Treatments   Labs (all labs ordered are listed, but only abnormal results are displayed) Labs Reviewed - No data to display  EKG None  Radiology DG Knee 2 Views Right  Result Date: 08/08/2023 CLINICAL DATA:  Insect bite to the lateral side of the right knee. 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 Similar changes seen on 01/13/2018 with interval healing and callus formation. No evidence of acute fracture or dislocation. No focal bone lesion or bone destruction. No significant effusions. Soft tissues are intact. No radiopaque soft tissue foreign bodies or soft tissue gas. IMPRESSION: Old fracture deformity of the patella.  No acute bony abnormalities. Electronically Signed   By: Burman Nieves M.D.   On: 08/08/2023 20:19    Procedures Procedures    Medications Ordered in ED Medications  doxycycline (VIBRA-TABS) tablet 100 mg (100 mg Oral Given 08/08/23 2127)  HYDROcodone-acetaminophen (NORCO/VICODIN) 5-325 MG per  tablet 1 tablet (1 tablet Oral Given 08/08/23 2127)    ED Course/ Medical Decision Making/ A&P Clinical Course as of 08/08/23 2202  Thu Aug 08, 2023  2201 Patient with right knee pain with ulcerated area laterally most c/w spider bite. Doubt intra-articular infection. No fluctuance to suggest abscess. Will start abx, provide pain management. Stressed importance of recheck in 2 days by returning here (no PCP). Return precautions discussed that would prompt earlier return.  [SU]    Clinical Course User Index [SU] Elpidio Anis, PA-C                                 Medical Decision Making Amount and/or Complexity of Data Reviewed Radiology: ordered.  Risk Prescription drug management.           Final Clinical Impression(s) / ED Diagnoses Final diagnoses:  Insect bite of right knee, initial encounter    Rx / DC Orders ED Discharge Orders          Ordered    doxycycline (VIBRAMYCIN) 100 MG capsule  2 times daily        08/08/23 2114    HYDROcodone-acetaminophen (NORCO/VICODIN) 5-325 MG tablet  Every 4 hours PRN,   Status:  Discontinued        08/08/23 2114    ibuprofen (ADVIL) 600 MG tablet  Every 6 hours PRN        08/08/23 2114    HYDROcodone-acetaminophen (NORCO/VICODIN) 5-325 MG tablet  Every 4 hours PRN        08/08/23 2115              Elpidio Anis, PA-C 08/08/23 2202    Terrilee Files, MD 08/09/23 1031

## 2023-08-08 NOTE — ED Notes (Signed)
Discharge instructions and medications gone over. Pt verbally understood with NAD

## 2023-08-08 NOTE — ED Triage Notes (Signed)
Spider bite to RIGHT knee 3 days ago  Noted redness and swelling in triage

## 2023-08-08 NOTE — ED Notes (Signed)
Noted HTN in triage Pt stated he took himself of his BP meds about 2 years ago

## 2024-09-23 ENCOUNTER — Other Ambulatory Visit: Payer: Self-pay

## 2024-09-23 ENCOUNTER — Emergency Department (HOSPITAL_COMMUNITY)
Admission: EM | Admit: 2024-09-23 | Discharge: 2024-09-23 | Disposition: A | Discharge status: Home / Self Care | Attending: Emergency Medicine | Admitting: Emergency Medicine

## 2024-09-23 ENCOUNTER — Emergency Department (HOSPITAL_COMMUNITY)

## 2024-09-23 ENCOUNTER — Encounter (HOSPITAL_COMMUNITY): Payer: Self-pay

## 2024-09-23 DIAGNOSIS — I10 Essential (primary) hypertension: Secondary | ICD-10-CM | POA: Insufficient documentation

## 2024-09-23 DIAGNOSIS — Z79899 Other long term (current) drug therapy: Secondary | ICD-10-CM | POA: Diagnosis not present

## 2024-09-23 DIAGNOSIS — R519 Headache, unspecified: Secondary | ICD-10-CM | POA: Diagnosis present

## 2024-09-23 DIAGNOSIS — R202 Paresthesia of skin: Secondary | ICD-10-CM | POA: Diagnosis not present

## 2024-09-23 LAB — CBC WITH DIFFERENTIAL/PLATELET
Abs Immature Granulocytes: 0.04 K/uL (ref 0.00–0.07)
Basophils Absolute: 0.1 K/uL (ref 0.0–0.1)
Basophils Relative: 1 %
Eosinophils Absolute: 0.1 K/uL (ref 0.0–0.5)
Eosinophils Relative: 2 %
HCT: 44.9 % (ref 39.0–52.0)
Hemoglobin: 15.2 g/dL (ref 13.0–17.0)
Immature Granulocytes: 1 %
Lymphocytes Relative: 23 %
Lymphs Abs: 1.5 K/uL (ref 0.7–4.0)
MCH: 34.1 pg — ABNORMAL HIGH (ref 26.0–34.0)
MCHC: 33.9 g/dL (ref 30.0–36.0)
MCV: 100.7 fL — ABNORMAL HIGH (ref 80.0–100.0)
Monocytes Absolute: 0.4 K/uL (ref 0.1–1.0)
Monocytes Relative: 6 %
Neutro Abs: 4.3 K/uL (ref 1.7–7.7)
Neutrophils Relative %: 67 %
Platelets: 306 K/uL (ref 150–400)
RBC: 4.46 MIL/uL (ref 4.22–5.81)
RDW: 12.8 % (ref 11.5–15.5)
WBC: 6.4 K/uL (ref 4.0–10.5)
nRBC: 0 % (ref 0.0–0.2)

## 2024-09-23 LAB — MAGNESIUM: Magnesium: 2.3 mg/dL (ref 1.7–2.4)

## 2024-09-23 LAB — COMPREHENSIVE METABOLIC PANEL WITH GFR
ALT: 29 U/L (ref 0–44)
AST: 29 U/L (ref 15–41)
Albumin: 4.4 g/dL (ref 3.5–5.0)
Alkaline Phosphatase: 74 U/L (ref 38–126)
Anion gap: 12 (ref 5–15)
BUN: 10 mg/dL (ref 8–23)
CO2: 23 mmol/L (ref 22–32)
Calcium: 9.2 mg/dL (ref 8.9–10.3)
Chloride: 100 mmol/L (ref 98–111)
Creatinine, Ser: 1.21 mg/dL (ref 0.61–1.24)
GFR, Estimated: >60 mL/min (ref >60)
Glucose, Bld: 121 mg/dL — ABNORMAL HIGH (ref 70–99)
Potassium: 3.9 mmol/L (ref 3.5–5.1)
Sodium: 135 mmol/L (ref 135–145)
Total Bilirubin: 0.5 mg/dL (ref 0.0–1.2)
Total Protein: 7.4 g/dL (ref 6.5–8.1)

## 2024-09-23 MED ORDER — NAPROXEN 500 MG PO TABS: 500.0000 mg | ORAL_TABLET | Freq: Two times a day (BID) | ORAL | 0 refills | Status: DC

## 2024-09-23 MED ORDER — HYDROCHLOROTHIAZIDE 12.5 MG PO TABS: 12.5000 mg | ORAL_TABLET | Freq: Every day | ORAL | 1 refills | Status: DC

## 2024-09-23 MED ORDER — ACETAMINOPHEN 500 MG PO TABS
1000.0000 mg | ORAL_TABLET | Freq: Once | ORAL | Status: AC
  Administered 2024-09-23: 1000 mg via ORAL
  Filled 2024-09-23: qty 2

## 2024-09-23 MED ORDER — LOSARTAN POTASSIUM 50 MG PO TABS: 50.0000 mg | ORAL_TABLET | Freq: Every day | ORAL | 1 refills | Status: DC

## 2024-09-23 MED ORDER — BUTALBITAL-APAP-CAFFEINE 50-325-40 MG PO TABS: 1.0000 | ORAL_TABLET | Freq: Four times a day (QID) | ORAL | 0 refills | Status: DC | PRN

## 2024-09-23 MED ORDER — HYDROCHLOROTHIAZIDE 12.5 MG PO TABS: 12.5000 mg | ORAL_TABLET | Freq: Every day | ORAL | 1 refills | Status: AC

## 2024-09-23 MED ORDER — BUTALBITAL-APAP-CAFFEINE 50-325-40 MG PO TABS: 1.0000 | ORAL_TABLET | Freq: Four times a day (QID) | ORAL | 0 refills | Status: AC | PRN

## 2024-09-23 MED ORDER — NAPROXEN 500 MG PO TABS: 500.0000 mg | ORAL_TABLET | Freq: Two times a day (BID) | ORAL | 0 refills | Status: AC

## 2024-09-23 MED ORDER — LOSARTAN POTASSIUM 50 MG PO TABS: 50.0000 mg | ORAL_TABLET | Freq: Every day | ORAL | 1 refills | Status: AC

## 2024-09-23 MED ORDER — MORPHINE SULFATE (PF) 4 MG/ML IV SOLN
4.0000 mg | Freq: Once | INTRAVENOUS | Status: AC
  Administered 2024-09-23: 4 mg via INTRAVENOUS
  Filled 2024-09-23: qty 1

## 2024-09-23 MED ORDER — HYDRALAZINE HCL 20 MG/ML IJ SOLN
10.0000 mg | INTRAMUSCULAR | Status: AC
  Administered 2024-09-23: 10 mg via INTRAVENOUS
  Filled 2024-09-23: qty 1

## 2024-09-23 NOTE — Discharge Instructions (Signed)
 Thankfully your testing has been reassuring, I do want you to take the following medications  Increase the losartan  to 50 mg a day, add hydrochlorothiazide 12.5 mg a day and I prescribed both Naprosyn and Fioricet to help with headaches.  You will need to follow-up closely with your doctor in 1 week at your appointment to have your blood pressure rechecked, return to the ER for severe or worsening symptoms

## 2024-09-23 NOTE — ED Triage Notes (Addendum)
 Patient come in POV, for elevated blood pressure stated started a new medication 5 days ago. Symptoms started prior to new mediation. Patient stated I didn't have any medication prior, just feels numbness to head, bilateral shoulders and bilateral hands and blurry vision that started two days ago with weakness in legs.

## 2024-09-23 NOTE — ED Provider Notes (Signed)
 [name] EMERGENCY DEPARTMENT AT [facility] Provider Note   CSN: [id number] Arrival date & time: 09/23/24  9257     Patient presents with: Hypertension and Numbness   David Watson is a 63 y.o. male.    Hypertension     The patient is a 63 year old male presenting with a complaint of high blood pressure and bilateral arm tingling.  The patient has a longtime history of hypertension, states he has not been on medication in about 6 years, when asked why he does not take the medication he states that he just stopped taking all of his medications because I am a man.  He has recently been back to his family doctor within the last 4 or 5 days and they started losartan  25 mg daily.  During that time the patient has been taking it once a day but has noticed that his blood pressure continues to be elevated in the 185 range.  Because he was having ongoing tingling and numbness in his bilateral upper extremities he decided to come get checked out.  The patient has no chest pain, occasional shortness of breath, no swelling of the legs, generalized fatigue and weakness for quite some time and blurry vision for quite some time meaning more than several months he decided to come get checked out this morning.  No nausea vomiting fevers chills or difficulty speaking or coordination.  He is accompanied by his significant other who gives additional history.  Prior to Admission medications   Medication Sig Start Date End Date Taking? Authorizing Provider  Aspirin -Acetaminophen -Caffeine (GOODYS EXTRA STRENGTH PO) Take 1 packet by mouth as needed (pain and fever).   Yes [provider]  butalbital-acetaminophen -caffeine (FIORICET) 50-325-40 MG tablet Take 1-2 tablets by mouth every 6 (six) hours as needed for headache. 09/23/24 09/23/25 Yes Cleotilde Rogue, MD  hydrochlorothiazide (HYDRODIURIL) 12.5 MG tablet Take 1 tablet (12.5 mg total) by mouth daily. 09/23/24  Yes Cleotilde Rogue, MD   ibuprofen  (ADVIL ) 600 MG tablet Take 1 tablet (600 mg total) by mouth every 6 (six) hours as needed. 08/08/23  Yes Upstill, Margit, PA-C  losartan  (COZAAR ) 50 MG tablet Take 1 tablet (50 mg total) by mouth daily. 09/23/24  Yes Cleotilde Rogue, MD  naproxen (NAPROSYN) 500 MG tablet Take 1 tablet (500 mg total) by mouth 2 (two) times daily with a meal. 09/23/24  Yes Cleotilde Rogue, MD  albuterol  (VENTOLIN  HFA) 108 (90 Base) MCG/ACT inhaler Inhale 1-2 puffs into the lungs every 6 (six) hours as needed for wheezing or shortness of breath. Patient not taking: Reported on 09/23/2024 04/07/20   Ricky Fines, MD  HYDROcodone -acetaminophen  (NORCO/VICODIN) 5-325 MG tablet Take 1-2 tablets by mouth every 4 (four) hours as needed. 08/08/23   Odell Margit, PA-C    Allergies: Lisinopril    Review of Systems  All other systems reviewed and are negative.   Updated Vital Signs BP (!) 165/104   Pulse 73   Temp 97.7 F (36.5 C) (Oral)   Resp 16   SpO2 96%   Physical Exam Vitals and nursing note reviewed.  Constitutional:      General: He is not in acute distress.    Appearance: He is well-developed.  HENT:     Head: Normocephalic and atraumatic.     Mouth/Throat:     Pharynx: No oropharyngeal exudate.  Eyes:     General: No scleral icterus.       Right eye: No discharge.  Left eye: No discharge.     Conjunctiva/sclera: Conjunctivae normal.     Pupils: Pupils are equal, round, and reactive to light.  Neck:     Thyroid : No thyromegaly.     Vascular: No JVD.  Cardiovascular:     Rate and Rhythm: Normal rate and regular rhythm.     Heart sounds: Normal heart sounds. No murmur heard.    No friction rub. No gallop.  Pulmonary:     Effort: Pulmonary effort is normal. No respiratory distress.     Breath sounds: Normal breath sounds. No wheezing or rales.  Abdominal:     General: Bowel sounds are normal. There is no distension.     Palpations: Abdomen is soft. There is no mass.      Tenderness: There is no abdominal tenderness.  Musculoskeletal:        General: No tenderness. Normal range of motion.     Cervical back: Normal range of motion and neck supple.  Lymphadenopathy:     Cervical: No cervical adenopathy.  Skin:    General: Skin is warm and dry.     Findings: No erythema or rash.  Neurological:     Mental Status: He is alert.     Coordination: Coordination normal.     Comments: Slight decrease sensation of the bilateral hands but has totally normal strength, his bilateral lower extremities are totally normal in strength and sensation, his cranial nerves III through XII are totally normal, visual acuity is baseline and his peripheral visual fields are normal as well.  Psychiatric:        Behavior: Behavior normal.     (all labs ordered are listed, but only abnormal results are displayed) Labs Reviewed  CBC WITH DIFFERENTIAL/PLATELET - Abnormal; Notable for the following components:      Result Value   MCV 100.7 (*)    MCH 34.1 (*)    All other components within normal limits  COMPREHENSIVE METABOLIC PANEL WITH GFR - Abnormal; Notable for the following components:   Glucose, Bld 121 (*)    All other components within normal limits  MAGNESIUM  CBC WITH DIFFERENTIAL/PLATELET    EKG: EKG Interpretation Date/Time:  Wednesday September 23 2024 07:59:29 EST Ventricular Rate:  81 PR Interval:  219 QRS Duration:  111 QT Interval:  400 QTC Calculation: 465 R Axis:   -66  Text Interpretation: Sinus rhythm Borderline prolonged PR interval Left anterior fascicular block RSR' in V1 or V2, right VCD or RVH Probable left ventricular hypertrophy Baseline wander in lead(s) II III aVF Confirmed by Cleotilde Rogue ([id number]) on 09/23/2024 8:06:31 AM  Radiology: CT Head Wo Contrast Result Date: 09/23/2024 EXAM: CT HEAD WITHOUT 09/23/2024 08:36:59 AM TECHNIQUE: CT of the head was performed without the administration of intravenous contrast. Automated exposure control,  iterative reconstruction, and/or weight based adjustment of the mA/kV was utilized to reduce the radiation dose to as low as reasonably achievable. COMPARISON: CT of the head dated 04/06/2020. CLINICAL HISTORY: Neuro deficit, acute, stroke suspected; arm numbness. FINDINGS: BRAIN AND VENTRICLES: No acute intracranial hemorrhage. No mass effect or midline shift. No extra-axial fluid collection. No evidence of acute infarct. No hydrocephalus. ORBITS: No acute abnormality. SINUSES AND MASTOIDS: No acute abnormality. SOFT TISSUES AND SKULL: No acute skull fracture. No acute soft tissue abnormality. IMPRESSION: 1. No acute intracranial abnormality. Electronically signed by: Evalene Coho MD 09/23/2024 08:47 AM EST RP Workstation: [id number]     Procedures   Medications Ordered in the ED  morphine  (PF)  4 MG/ML injection 4 mg (has no administration in time range)  acetaminophen  (TYLENOL ) tablet 1,000 mg (1,000 mg Oral Given 09/23/24 0951)  hydrALAZINE  (APRESOLINE ) injection 10 mg (10 mg Intravenous Given 09/23/24 1051)                                    Medical Decision Making Amount and/or Complexity of Data Reviewed Labs: ordered. Radiology: ordered.  Risk OTC drugs. Prescription drug management.    This patient presents to the ED for concern of severe hypertension and bilateral arm numbness, this involves an extensive number of treatment options, and is a complaint that carries with it a high risk of complications and morbidity.  The differential diagnosis includes hypertensive symptomatology, no signs of acute lateralizing neurologic symptoms to suggest stroke   Co morbidities / Chronic conditions that complicate the patient evaluation  Untreated hypertension, obesity, he continues to smoke cigarettes although he tried to quit about a week and a half ago.  He has a 1/2/day smoker, also the patient has several beers per night   Additional history obtained:  Additional history  obtained from EMR External records from outside source obtained and reviewed including medical record The patient had a prior admission to the hospital here about 4-1/2 years ago for a seizure, he does not take Keppra  anymore, he stopped taking it by himself as well.  The patient was diagnosed with a transient ischemic attack during that admission.   Lab Tests:  I Ordered, and personally interpreted labs.  The pertinent results include: CBC without any findings of concern, metabolic panel with normal electrolytes and kidney function, LFTs are normal, magnesium is normal at 2.3   Imaging Studies ordered:  I ordered imaging studies including CT scan of the head I independently visualized and interpreted imaging which showed no acute abnormalities I agree with the radiologist interpretation   Cardiac Monitoring: / EKG:  The patient was maintained on a cardiac monitor.  I personally viewed and interpreted the cardiac monitored which showed an underlying rhythm of: Normal sinus rhythm   Problem List / ED Course / Critical interventions / Medication management  Headache unremarkable, likely has element of migraine could be blood pressure related, blood pressure was treated and improved to 164/22 I ordered medication including hydralazine , morphine  Reevaluation of the patient after these medicines showed that the patient headache and blood pressure both improved I have reviewed the patients home medicines and have made adjustments as needed   Consultations Obtained:  Patient has follow-up in 1 week at PCP   Social Determinants of Health:  Tobacco use   Test / Admission - Considered:  Considered admission but blood pressure improved, the patient has been chronically hypertensive and noncompliant with medications for about 5 years.  He has been increased on the losartan  to 50 mg from 25 and I added the hydrochlorothiazide which she will take for the week.  He was also given some  Fioricet for home.  Patient agreeable to discharge, no focal neurologic deficits or signs of endorgan injury      Final diagnoses:  Essential hypertension  Acute nonintractable headache, unspecified headache type    ED Discharge Orders          Ordered    losartan  (COZAAR ) 50 MG tablet  Daily        09/23/24 1400    hydrochlorothiazide (HYDRODIURIL) 12.5 MG tablet  Daily  09/23/24 1400    butalbital-acetaminophen -caffeine (FIORICET) 50-325-40 MG tablet  Every 6 hours PRN        09/23/24 1440    naproxen (NAPROSYN) 500 MG tablet  2 times daily with meals        09/23/24 1440               Cleotilde Rogue, MD 09/23/24 1444

## 2024-09-23 NOTE — ED Notes (Signed)
 ED Provider at bedside.
